# Patient Record
Sex: Female | Born: 1965 | Race: White | Hispanic: No | Marital: Single | Smoking: Former smoker
Health system: Southern US, Community
[De-identification: ages and names within clinical notes are randomized; demographics above are authoritative.]

## PROBLEM LIST (undated history)

## (undated) DIAGNOSIS — I10 Essential (primary) hypertension: Secondary | ICD-10-CM

## (undated) DIAGNOSIS — C50919 Malignant neoplasm of unspecified site of unspecified female breast: Secondary | ICD-10-CM

## (undated) DIAGNOSIS — K589 Irritable bowel syndrome without diarrhea: Secondary | ICD-10-CM

## (undated) DIAGNOSIS — E785 Hyperlipidemia, unspecified: Secondary | ICD-10-CM

## (undated) DIAGNOSIS — K219 Gastro-esophageal reflux disease without esophagitis: Secondary | ICD-10-CM

## (undated) HISTORY — DX: Irritable bowel syndrome, unspecified: K58.9

## (undated) HISTORY — DX: Essential (primary) hypertension: I10

## (undated) HISTORY — PX: BREAST LUMPECTOMY: SHX2

## (undated) HISTORY — DX: Hyperlipidemia, unspecified: E78.5

## (undated) HISTORY — DX: Gastro-esophageal reflux disease without esophagitis: K21.9

## (undated) HISTORY — PX: HERNIA REPAIR: SHX51

---

## 1988-12-11 HISTORY — PX: TUBAL LIGATION: SHX77

## 2001-03-27 ENCOUNTER — Other Ambulatory Visit: Admission: RE | Admit: 2001-03-27 | Discharge: 2001-03-27 | Payer: Self-pay | Admitting: Family Medicine

## 2009-11-12 ENCOUNTER — Encounter: Admission: RE | Admit: 2009-11-12 | Discharge: 2009-12-09 | Payer: Self-pay | Admitting: Family Medicine

## 2012-08-08 ENCOUNTER — Encounter: Payer: Self-pay | Admitting: Gastroenterology

## 2012-09-30 ENCOUNTER — Encounter: Payer: Self-pay | Admitting: Gastroenterology

## 2013-08-12 ENCOUNTER — Encounter: Payer: Self-pay | Admitting: Family Medicine

## 2013-08-12 ENCOUNTER — Ambulatory Visit (INDEPENDENT_AMBULATORY_CARE_PROVIDER_SITE_OTHER): Payer: BC Managed Care – PPO | Admitting: Family Medicine

## 2013-08-12 VITALS — BP 155/90 | HR 101 | Temp 99.9°F | Ht 63.0 in | Wt 186.4 lb

## 2013-08-12 DIAGNOSIS — L0291 Cutaneous abscess, unspecified: Secondary | ICD-10-CM

## 2013-08-12 DIAGNOSIS — L0390 Cellulitis, unspecified: Secondary | ICD-10-CM

## 2013-08-12 MED ORDER — CEPHALEXIN 500 MG PO CAPS: 500.0000 mg | ORAL_CAPSULE | Freq: Three times a day (TID) | ORAL | Status: DC

## 2013-08-13 NOTE — Progress Notes (Signed)
  Subjective:    Patient ID: Grace Gomez, female    DOB: [birth year], 47 y.o.   MRN: [id number]  HPI This 47 y.o. female presents for evaluation of possible ingrown hair in her groin area.   Review of Systems C/o ingrown hair left groin   No chest pain, SOB, HA, dizziness, vision change, N/V, diarrhea, constipation, dysuria, urinary urgency or frequency, myalgias, arthralgias or rash.  Objective:   Physical Exam Vital signs noted  Well developed well nourished female.  HEENT - Head atraumatic Normocephalic                Eyes - PERRLA, Conjuctiva - clear Sclera- Clear EOMI                Ears - EAC's Wnl TM's Wnl Gross Hearing WNL                Nose - Nares patent                 Throat - oropharanx wnl Respiratory - Lungs CTA bilateral Cardiac - RRR S1 and S2 without murmur Skin - Area of erythema and swelling right groin area without cyst or abcess.  approx Diameter is 2cm       Assessment & Plan:  Cellulitis - Plan: cephALEXin (KEFLEX) 500 MG capsule po qid x 7days #28  Apply warm Soaks.  Follow up prn.

## 2013-08-13 NOTE — Patient Instructions (Signed)
Cellulitis Cellulitis is an infection of the skin and the tissue beneath it. The infected area is usually red and tender. Cellulitis occurs most often in the arms and lower legs.  CAUSES  Cellulitis is caused by bacteria that enter the skin through cracks or cuts in the skin. The most common types of bacteria that cause cellulitis are Staphylococcus and Streptococcus. SYMPTOMS   Redness and warmth.  Swelling.  Tenderness or pain.  Fever. DIAGNOSIS  Your caregiver can usually determine what is wrong based on a physical exam. Blood tests may also be done. TREATMENT  Treatment usually involves taking an antibiotic medicine. HOME CARE INSTRUCTIONS   Take your antibiotics as directed. Finish them even if you start to feel better.  Keep the infected arm or leg elevated to reduce swelling.  Apply a warm cloth to the affected area up to 4 times per day to relieve pain.  Only take over-the-counter or prescription medicines for pain, discomfort, or fever as directed by your caregiver.  Keep all follow-up appointments as directed by your caregiver. SEEK MEDICAL CARE IF:   You notice red streaks coming from the infected area.  Your red area gets larger or turns dark in color.  Your bone or joint underneath the infected area becomes painful after the skin has healed.  Your infection returns in the same area or another area.  You notice a swollen bump in the infected area.  You develop new symptoms. SEEK IMMEDIATE MEDICAL CARE IF:   You have a fever.  You feel very sleepy.  You develop vomiting or diarrhea.  You have a general ill feeling (malaise) with muscle aches and pains. MAKE SURE YOU:   Understand these instructions.  Will watch your condition.  Will get help right away if you are not doing well or get worse. Document Released: 09/06/2005 Document Revised: 05/28/2012 Document Reviewed: 02/12/2012 ExitCare Patient Information 2014 [employer].  

## 2013-08-20 ENCOUNTER — Ambulatory Visit (INDEPENDENT_AMBULATORY_CARE_PROVIDER_SITE_OTHER): Payer: BC Managed Care – PPO | Admitting: Family Medicine

## 2013-08-20 ENCOUNTER — Encounter: Payer: Self-pay | Admitting: Family Medicine

## 2013-08-20 VITALS — BP 127/71 | HR 76 | Temp 97.5°F | Ht 63.0 in | Wt 187.0 lb

## 2013-08-20 DIAGNOSIS — Z0000 Encounter for general adult medical examination without abnormal findings: Secondary | ICD-10-CM

## 2013-08-20 DIAGNOSIS — L309 Dermatitis, unspecified: Secondary | ICD-10-CM

## 2013-08-20 DIAGNOSIS — Z01419 Encounter for gynecological examination (general) (routine) without abnormal findings: Secondary | ICD-10-CM

## 2013-08-20 LAB — POCT CBC
Granulocyte percent: 74.9 %G (ref 37–80)
HCT, POC: 39.2 % (ref 37.7–47.9)
Hemoglobin: 14 g/dL (ref 12.2–16.2)
Lymph, poc: 1.9 (ref 0.6–3.4)
MCH, POC: 30.8 pg (ref 27–31.2)
MCHC: 35.6 g/dL — AB (ref 31.8–35.4)
MCV: 86.7 fL (ref 80–97)
MPV: 7.9 fL (ref 0–99.8)
POC Granulocyte: 7 — AB (ref 2–6.9)
POC LYMPH PERCENT: 20.4 %L (ref 10–50)
Platelet Count, POC: 252 10*3/uL (ref 142–424)
RBC: 4.5 M/uL (ref 4.04–5.48)
RDW, POC: 12.5 %
WBC: 9.3 10*3/uL (ref 4.6–10.2)

## 2013-08-20 MED ORDER — NYSTATIN-TRIAMCINOLONE 100000-0.1 UNIT/GM-% EX OINT: TOPICAL_OINTMENT | Freq: Two times a day (BID) | CUTANEOUS | Status: DC

## 2013-08-20 NOTE — Addendum Note (Signed)
Addended by: Roselyn Reef on: 08/20/2013 02:31 PM   Modules accepted: Orders

## 2013-08-20 NOTE — Patient Instructions (Signed)
Pap Test A Pap test checks the cells on the surface of your cervix. Your doctor will look for cell changes that are not normal, an infection, or cancer. If the cells no longer look normal, it is called dysplasia. Dysplasia can turn into cancer. Regular Pap tests are important to stop cancer from developing. BEFORE THE PROCEDURE  Ask your doctor when to schedule your Pap test. Timing the test around your period may be important.  Do not douche or have sex (intercourse) for 24 hours before the test.  Do not put creams on your vagina or use tampons for 24 hours before the test.  Go pee (urinate) just before the test. PROCEDURE  You will lie on an exam table with your feet in stirrups.  A warm metal or plastic tool (speculum) will be put in your vagina to open it up.  Your doctor will use a small, plastic brush or wooden spatula to take cells from your cervix.  The cells will be put in a lab container.  The cells will be checked under a microscope to see if they are normal or not. AFTER THE PROCEDURE Get your test results. If they are abnormal, you may need more tests. Document Released: 12/30/2010 Document Revised: 02/19/2012 Document Reviewed: 11/23/2011 ExitCare Patient Information 2014 [employer].  

## 2013-08-20 NOTE — Progress Notes (Signed)
  Subjective:    Patient ID: Grace Gomez, female    DOB: 19-Feb-1966, 47 y.o.   MRN: [id number]  HPI This 47 y.o. female presents for evaluation of CPE with pap.  She is here for women's health PE.  She is G3 P3.  She had NVD without complications.  She is still menstruating.  She  Has normal periods.  She has been having some hot flashes.  She has no abnormal pap Smears.  She has hx of abnormal mammo last year and she had a repeat mammo that was Normal.  She is c/o of skin tag on her peri region.   Review of Systems No chest pain, SOB, HA, dizziness, vision change, N/V, diarrhea, constipation, dysuria, urinary urgency or frequency, myalgias, arthralgias or rash.     Objective:   Physical Exam Vital signs noted  Well developed well nourished female.  HEENT - Head atraumatic Normocephalic                Throat - oropharanx wnl Respiratory - Lungs CTA bilateral Cardiac - RRR S1 and S2 without murmur Breast - No masses and WNL bilateral, no nipple DC bilateral. GI - Abdomen soft Nontender and bowel sounds active x 4 GU - BUS normal, vagina rugated and wnl, cervix parous os, negative CMT and adenexa without masses. Rectal- tone wnl no masses hemocult negative Extremities - No edema. Neuro - Grossly intact. Skin- Dermatitis left perineum      Assessment & Plan:  Encounter for routine gynecological examination - Plan: Pap IG w/ reflex to HPV when ASC-U, POCT CBC, CMP14+EGFR, Lipid panel, Thyroid Panel With TSH. She is awaiting her mammogram appointment.    Dermatitis - Plan: nystatin-triamcinolone ointment (MYCOLOG)

## 2013-08-21 ENCOUNTER — Other Ambulatory Visit: Payer: Self-pay | Admitting: Family Medicine

## 2013-08-21 DIAGNOSIS — E785 Hyperlipidemia, unspecified: Secondary | ICD-10-CM

## 2013-08-21 LAB — LIPID PANEL
Chol/HDL Ratio: 6.2 ratio units — ABNORMAL HIGH (ref 0.0–4.4)
Cholesterol, Total: 236 mg/dL — ABNORMAL HIGH (ref 100–199)
HDL: 38 mg/dL — ABNORMAL LOW (ref >39)
LDL Calculated: 152 mg/dL — ABNORMAL HIGH (ref 0–99)
Triglycerides: 229 mg/dL — ABNORMAL HIGH (ref 0–149)
VLDL Cholesterol Cal: 46 mg/dL — ABNORMAL HIGH (ref 5–40)

## 2013-08-21 LAB — CMP14+EGFR
ALT: 14 IU/L (ref 0–32)
AST: 12 IU/L (ref 0–40)
Albumin/Globulin Ratio: 1.8 (ref 1.1–2.5)
Albumin: 4 g/dL (ref 3.5–5.5)
Alkaline Phosphatase: 82 IU/L (ref 39–117)
BUN/Creatinine Ratio: 17 (ref 9–23)
BUN: 13 mg/dL (ref 6–24)
CO2: 23 mmol/L (ref 18–29)
Calcium: 9.7 mg/dL (ref 8.7–10.2)
Chloride: 101 mmol/L (ref 97–108)
Creatinine, Ser: 0.78 mg/dL (ref 0.57–1.00)
GFR calc Af Amer: 103 mL/min/{1.73_m2} (ref >59)
GFR calc non Af Amer: 90 mL/min/{1.73_m2} (ref >59)
Globulin, Total: 2.2 g/dL (ref 1.5–4.5)
Glucose: 112 mg/dL — ABNORMAL HIGH (ref 65–99)
Potassium: 4.2 mmol/L (ref 3.5–5.2)
Sodium: 137 mmol/L (ref 134–144)
Total Bilirubin: 0.3 mg/dL (ref 0.0–1.2)
Total Protein: 6.2 g/dL (ref 6.0–8.5)

## 2013-08-21 LAB — THYROID PANEL WITH TSH
Free Thyroxine Index: 1.8 (ref 1.2–4.9)
T3 Uptake Ratio: 28 % (ref 24–39)
T4, Total: 6.6 ug/dL (ref 4.5–12.0)
TSH: 1.57 u[IU]/mL (ref 0.450–4.500)

## 2013-08-21 MED ORDER — PRAVASTATIN SODIUM 40 MG PO TABS: 40.0000 mg | ORAL_TABLET | Freq: Every day | ORAL | Status: DC

## 2013-08-23 LAB — GYN REPORT: PAP Smear Comment: 0

## 2013-08-23 LAB — SPECIMEN STATUS REPORT

## 2013-08-29 ENCOUNTER — Telehealth: Payer: Self-pay | Admitting: Family Medicine

## 2013-08-29 NOTE — Telephone Encounter (Signed)
Pt notified of lab results PA at work started her on Crestor 20mg  April, please check on pap and advise pt OK to leave message on machine

## 2013-08-29 NOTE — Telephone Encounter (Signed)
Message copied by Bearl Mulberry on Fri Aug 29, 2013  5:08 PM ------      Message from: Deatra Canter      Created: Fri Aug 29, 2013  8:10 AM       The pap needs repeating. Will notify patient. ------

## 2013-08-29 NOTE — Telephone Encounter (Signed)
Pt notified pap will need to be repeated

## 2013-09-03 ENCOUNTER — Ambulatory Visit: Payer: BC Managed Care – PPO | Admitting: Family Medicine

## 2013-09-03 ENCOUNTER — Other Ambulatory Visit: Payer: Self-pay | Admitting: *Deleted

## 2013-09-03 MED ORDER — OMEPRAZOLE 40 MG PO CPDR: 40.0000 mg | DELAYED_RELEASE_CAPSULE | Freq: Every day | ORAL | Status: DC

## 2013-09-17 ENCOUNTER — Telehealth: Payer: Self-pay | Admitting: Family Medicine

## 2013-09-19 NOTE — Telephone Encounter (Signed)
XRAY CAN YOU HELP WITH THIS PLEASE

## 2013-09-22 ENCOUNTER — Ambulatory Visit: Payer: BC Managed Care – PPO | Admitting: Family Medicine

## 2013-10-01 ENCOUNTER — Ambulatory Visit (INDEPENDENT_AMBULATORY_CARE_PROVIDER_SITE_OTHER): Payer: BC Managed Care – PPO | Admitting: Family Medicine

## 2013-10-01 VITALS — BP 123/81 | HR 75 | Temp 98.1°F | Ht 63.0 in | Wt 188.0 lb

## 2013-10-01 DIAGNOSIS — Z23 Encounter for immunization: Secondary | ICD-10-CM

## 2013-10-01 DIAGNOSIS — Z01419 Encounter for gynecological examination (general) (routine) without abnormal findings: Secondary | ICD-10-CM

## 2013-10-01 NOTE — Addendum Note (Signed)
Addended by: Orma Render F on: 10/01/2013 11:00 AM   Modules accepted: Orders

## 2013-10-01 NOTE — Progress Notes (Signed)
  Subjective:    Patient ID: Grace Gomez, female    DOB: 06/22/1966, 47 y.o.   MRN: [id number]  HPI This 47 y.o. female presents for evaluation of repeat pap smear.  There was evidently a lab Error and she was unagle to get her pap.   Review of Systems No chest pain, SOB, HA, dizziness, vision change, N/V, diarrhea, constipation, dysuria, urinary urgency or frequency, myalgias, arthralgias or rash.     Objective:   Physical Exam Vital signs noted  Well developed well nourished female.  HEENT - Head atraumatic Normocephalic                Eyes - PERRLA, Conjuctiva - clear Sclera- Clear EOMI Resp - Lungs CTA Card - RRR s1 and s2 w/o Murmur. GU - Bus normal , vagina normal and cervix parous os and pap re-obtained.      Assessment & Plan:  Pap smear - Reobtained.

## 2013-10-06 LAB — PAP IG W/ RFLX HPV ASCU: PAP Smear Comment: 0

## 2014-02-06 ENCOUNTER — Ambulatory Visit (INDEPENDENT_AMBULATORY_CARE_PROVIDER_SITE_OTHER): Payer: BC Managed Care – PPO | Admitting: Family Medicine

## 2014-02-06 ENCOUNTER — Ambulatory Visit (INDEPENDENT_AMBULATORY_CARE_PROVIDER_SITE_OTHER): Payer: BC Managed Care – PPO

## 2014-02-06 VITALS — BP 131/79 | HR 92 | Temp 97.1°F | Wt 193.0 lb

## 2014-02-06 DIAGNOSIS — R0602 Shortness of breath: Secondary | ICD-10-CM

## 2014-02-06 DIAGNOSIS — J209 Acute bronchitis, unspecified: Secondary | ICD-10-CM

## 2014-02-06 MED ORDER — ALBUTEROL SULFATE (2.5 MG/3ML) 0.083% IN NEBU: 2.5000 mg | INHALATION_SOLUTION | Freq: Four times a day (QID) | RESPIRATORY_TRACT | Status: DC | PRN

## 2014-02-06 MED ORDER — METHYLPREDNISOLONE SODIUM SUCC 125 MG IJ SOLR
125.0000 mg | Freq: Once | INTRAMUSCULAR | Status: AC
  Administered 2014-02-06: 125 mg via INTRAMUSCULAR

## 2014-02-06 MED ORDER — PREDNISONE 10 MG PO TABS: ORAL_TABLET | ORAL | Status: DC

## 2014-02-06 MED ORDER — UMECLIDINIUM-VILANTEROL 62.5-25 MCG/INH IN AEPB: 1.0000 | INHALATION_SPRAY | RESPIRATORY_TRACT | Status: DC

## 2014-02-06 MED ORDER — LEVOFLOXACIN 500 MG PO TABS: 500.0000 mg | ORAL_TABLET | Freq: Every day | ORAL | Status: DC

## 2014-02-06 MED ORDER — LEVALBUTEROL HCL 1.25 MG/3ML IN NEBU
1.2500 mg | INHALATION_SOLUTION | Freq: Once | RESPIRATORY_TRACT | Status: AC
  Administered 2014-02-06: 1.25 mg via RESPIRATORY_TRACT

## 2014-02-06 MED ORDER — LEVALBUTEROL HCL 1.25 MG/0.5ML IN NEBU: 1.2500 mg | INHALATION_SOLUTION | Freq: Once | RESPIRATORY_TRACT | Status: DC

## 2014-02-06 NOTE — Progress Notes (Signed)
Subjective:    Patient ID: Grace Gomez, female    DOB: July 04, 1966, 48 y.o.   MRN: [id number]  HPI This 48 y.o. female presents for evaluation of cough and shortness of breath.  She was  Seen Sunday in Urgent care and rx'd zpak and and steroid shot.  She was also rx'd tylenol #3 for cough and chest wall pain from cough.  She followed up 2 days later because she was SOB and she was wheezing and had tightness in her chest.  She was rx'd prednisone 50mg  po qd And she finished the prednisone today but is having sob and wheezing.  She has had 4 bouts of bronchitis this year.  She is usually tx'd by the PA at work.  She has 35 year hx of cigarette smoking  1 1/2 ppd.  She quit smoking 12 years ago.  She states she was told by the doctor at urgent care she needed a pulmonary referral for her SOB and suspected COPD.  She is also using saba albuterol MDI 2 puffs 4x day w/o relief.   Review of Systems C/o SOB, wheezing, cough, and uri sx's. No chest pain, HA, dizziness, vision change, N/V, diarrhea, constipation, dysuria, urinary urgency or frequency, myalgias, arthralgias or rash.     Objective:   Physical Exam Vital signs noted  Well developed well nourished female.  HEENT - Head atraumatic Normocephalic                Eyes - PERRLA, Conjuctiva - clear Sclera- Clear EOMI                Ears - EAC's Wnl TM's Wnl Gross Hearing WNL                Throat - oropharanx wnl Respiratory - Lungs with scattered wheezes bilateral A/P Cardiac - RRR S1 and S2 without murmur GI - Abdomen soft Nontender and bowel sounds active x 4 Extremities - No edema. Neuro - Grossly intact.  Patient is walked with pulse oximeter on room air and oxygen saturation was initially 98% and patient Was dyspneic and oxygen saturation drops to 94% after recovery period and then returns to 97%.   Neb tx - Post neb breath sounds with expiratory wheezes bilateral scattered and patient states she is breathing easier.   CXR  - No infiltrates Prelimnary reading by Iverson Alamin    Assessment & Plan:  SOB (shortness of breath) - Plan: DG Chest 2 View, levalbuterol (XOPENEX) nebulizer solution 1.25 mg, methylPREDNISolone sodium succinate (SOLU-MEDROL) 125 mg/2 mL injection 125 mg, levalbuterol (XOPENEX) nebulizer solution 1.25 mg, albuterol (PROVENTIL) (2.5 MG/3ML) 0.083% nebulizer solution, DME Nebulizer machine, levofloxacin (LEVAQUIN) 500 MG tablet, predniSONE (DELTASONE) 10 MG tablet, Ambulatory referral to Pulmonology, Umeclidinium-Vilanterol (ANORO ELLIPTA) 62.5-25 MCG/INH AEPB  Acute bronchitis - Plan: levalbuterol (XOPENEX) nebulizer solution 1.25 mg, methylPREDNISolone sodium succinate (SOLU-MEDROL) 125 mg/2 mL injection 125 mg, levalbuterol (XOPENEX) nebulizer solution 1.25 mg, albuterol (PROVENTIL) (2.5 MG/3ML) 0.083% nebulizer solution, DME Nebulizer machine, levofloxacin (LEVAQUIN) 500 MG tablet, predniSONE (DELTASONE) 10 MG tablet, Ambulatory referral to Pulmonology, Umeclidinium-Vilanterol (ANORO ELLIPTA) 62.5-25 MCG/INH AEPB  Anoro on puff qd #2 samples with savings card given.  Patient given neb in office and neb tubing given To patient along with rx for nebulizer.  Patient is advised to follow up prn if sx's continue or worsen. Discussed with patient that I am rx an 8 day steroid taper and to use the nebulizer 3-4 times a day for Next week  and then 2 times a day after this.  Discussed to take mucinex bid and to await call For pulmonary consult in the next few weeks.  Lysbeth Penner FNP

## 2014-02-16 ENCOUNTER — Institutional Professional Consult (permissible substitution): Payer: Self-pay | Admitting: Pulmonary Disease

## 2014-02-18 DIAGNOSIS — Z0289 Encounter for other administrative examinations: Secondary | ICD-10-CM

## 2014-02-27 ENCOUNTER — Telehealth: Payer: Self-pay | Admitting: Family Medicine

## 2014-03-04 NOTE — Telephone Encounter (Signed)
All FMLA papers complete, pt aware

## 2014-03-12 ENCOUNTER — Telehealth: Payer: Self-pay | Admitting: Family Medicine

## 2014-03-12 NOTE — Telephone Encounter (Signed)
Patient aware that we advise her to go to the ER for evaluation that this possibly could be coming from where she had surgery

## 2014-05-11 ENCOUNTER — Telehealth: Payer: Self-pay | Admitting: Family Medicine

## 2014-05-11 NOTE — Telephone Encounter (Signed)
appt given for thurs with Grace Gomez- patient states she does not have any sob, chest pain, patient requested a wed or thurs appt and Grace Gomez is off on Friday

## 2014-05-14 ENCOUNTER — Ambulatory Visit (INDEPENDENT_AMBULATORY_CARE_PROVIDER_SITE_OTHER): Payer: BC Managed Care – PPO | Admitting: Family Medicine

## 2014-05-14 ENCOUNTER — Encounter: Payer: Self-pay | Admitting: Family Medicine

## 2014-05-14 VITALS — BP 141/88 | HR 89 | Temp 97.1°F | Wt 191.0 lb

## 2014-05-14 DIAGNOSIS — R609 Edema, unspecified: Secondary | ICD-10-CM

## 2014-05-14 DIAGNOSIS — N390 Urinary tract infection, site not specified: Secondary | ICD-10-CM

## 2014-05-14 DIAGNOSIS — R350 Frequency of micturition: Secondary | ICD-10-CM

## 2014-05-14 LAB — POCT URINALYSIS DIPSTICK
Bilirubin, UA: NEGATIVE
Blood, UA: NEGATIVE
Glucose, UA: NEGATIVE
Ketones, UA: NEGATIVE
Leukocytes, UA: NEGATIVE
Nitrite, UA: NEGATIVE
Spec Grav, UA: >=1.03
Urobilinogen, UA: NEGATIVE
pH, UA: 5

## 2014-05-14 LAB — POCT UA - MICROSCOPIC ONLY
Casts, Ur, LPF, POC: NEGATIVE
Crystals, Ur, HPF, POC: NEGATIVE
Mucus, UA: NEGATIVE
RBC, urine, microscopic: NEGATIVE
Yeast, UA: NEGATIVE

## 2014-05-14 MED ORDER — FUROSEMIDE 20 MG PO TABS: ORAL_TABLET | ORAL | Status: DC

## 2014-05-14 MED ORDER — CIPROFLOXACIN HCL 500 MG PO TABS: 500.0000 mg | ORAL_TABLET | Freq: Two times a day (BID) | ORAL | Status: DC

## 2014-05-14 NOTE — Progress Notes (Signed)
   Subjective:    Patient ID: Grace Gomez, female    DOB: 1966-05-24, 48 y.o.   MRN: [id number]  HPI  This 48 y.o. female presents for evaluation of urinary frequency and edema in lower extremities.  She notices a strong Urine odor and frequency.  Review of Systems C/o urinary frequency and lower extremity edema.   No chest pain, SOB, HA, dizziness, vision change, N/V, diarrhea, constipation,myalgias, arthralgias or rash.  Objective:   Physical Exam  Vital signs noted  Well developed well nourished female.  HEENT - Head atraumatic Normocephalic                Eyes - PERRLA, Conjuctiva - clear Sclera- Clear EOMI                Ears - EAC's Wnl TM's Wnl Gross Hearing WNL                Throat - oropharanx wnl Respiratory - Lungs CTA bilateral Cardiac - RRR S1 and S2 without murmur GI - Abdomen soft Nontender and bowel sounds active x 4 Extremities - mild pre tibial edema      Assessment & Plan:  Urinary frequency - Plan: POCT UA - Microscopic Only, POCT urinalysis dipstick, ciprofloxacin (CIPRO) 500 MG tablet, Urine culture  Edema - Plan: furosemide (LASIX) 20 MG tablet, ciprofloxacin (CIPRO) 500 MG tablet, Urine culture  UTI (urinary tract infection) - Plan: furosemide (LASIX) 20 MG tablet, ciprofloxacin (CIPRO) 500 MG tablet, Urine culture  Lysbeth Penner FNP

## 2014-05-16 LAB — URINE CULTURE

## 2014-05-18 ENCOUNTER — Telehealth: Payer: Self-pay | Admitting: Family Medicine

## 2014-05-18 NOTE — Telephone Encounter (Signed)
Message copied by Waverly Ferrari on Mon May 18, 2014  2:51 PM ------      Message from: Lysbeth Penner      Created: Mon May 18, 2014  2:08 PM       ua cx is positive for uti and is sensitive to cipro ------

## 2014-06-14 ENCOUNTER — Other Ambulatory Visit: Payer: Self-pay | Admitting: Family Medicine

## 2014-06-15 ENCOUNTER — Telehealth: Payer: Self-pay | Admitting: Family Medicine

## 2014-06-15 MED ORDER — OMEPRAZOLE 40 MG PO CPDR
40.0000 mg | DELAYED_RELEASE_CAPSULE | Freq: Every day | ORAL | Status: DC
Start: 2014-06-15 — End: 2014-09-02

## 2014-06-15 NOTE — Telephone Encounter (Signed)
done

## 2014-08-24 IMAGING — CR DG CHEST 2V
2 series · 2 of 2 positions shown · non-contrast
Comparison: Chest x-ray 12/12/2011

CLINICAL DATA: Shortness of breath, history of bronchitis

EXAM:
CHEST  2 VIEW

[view not recorded (1 of 2)]
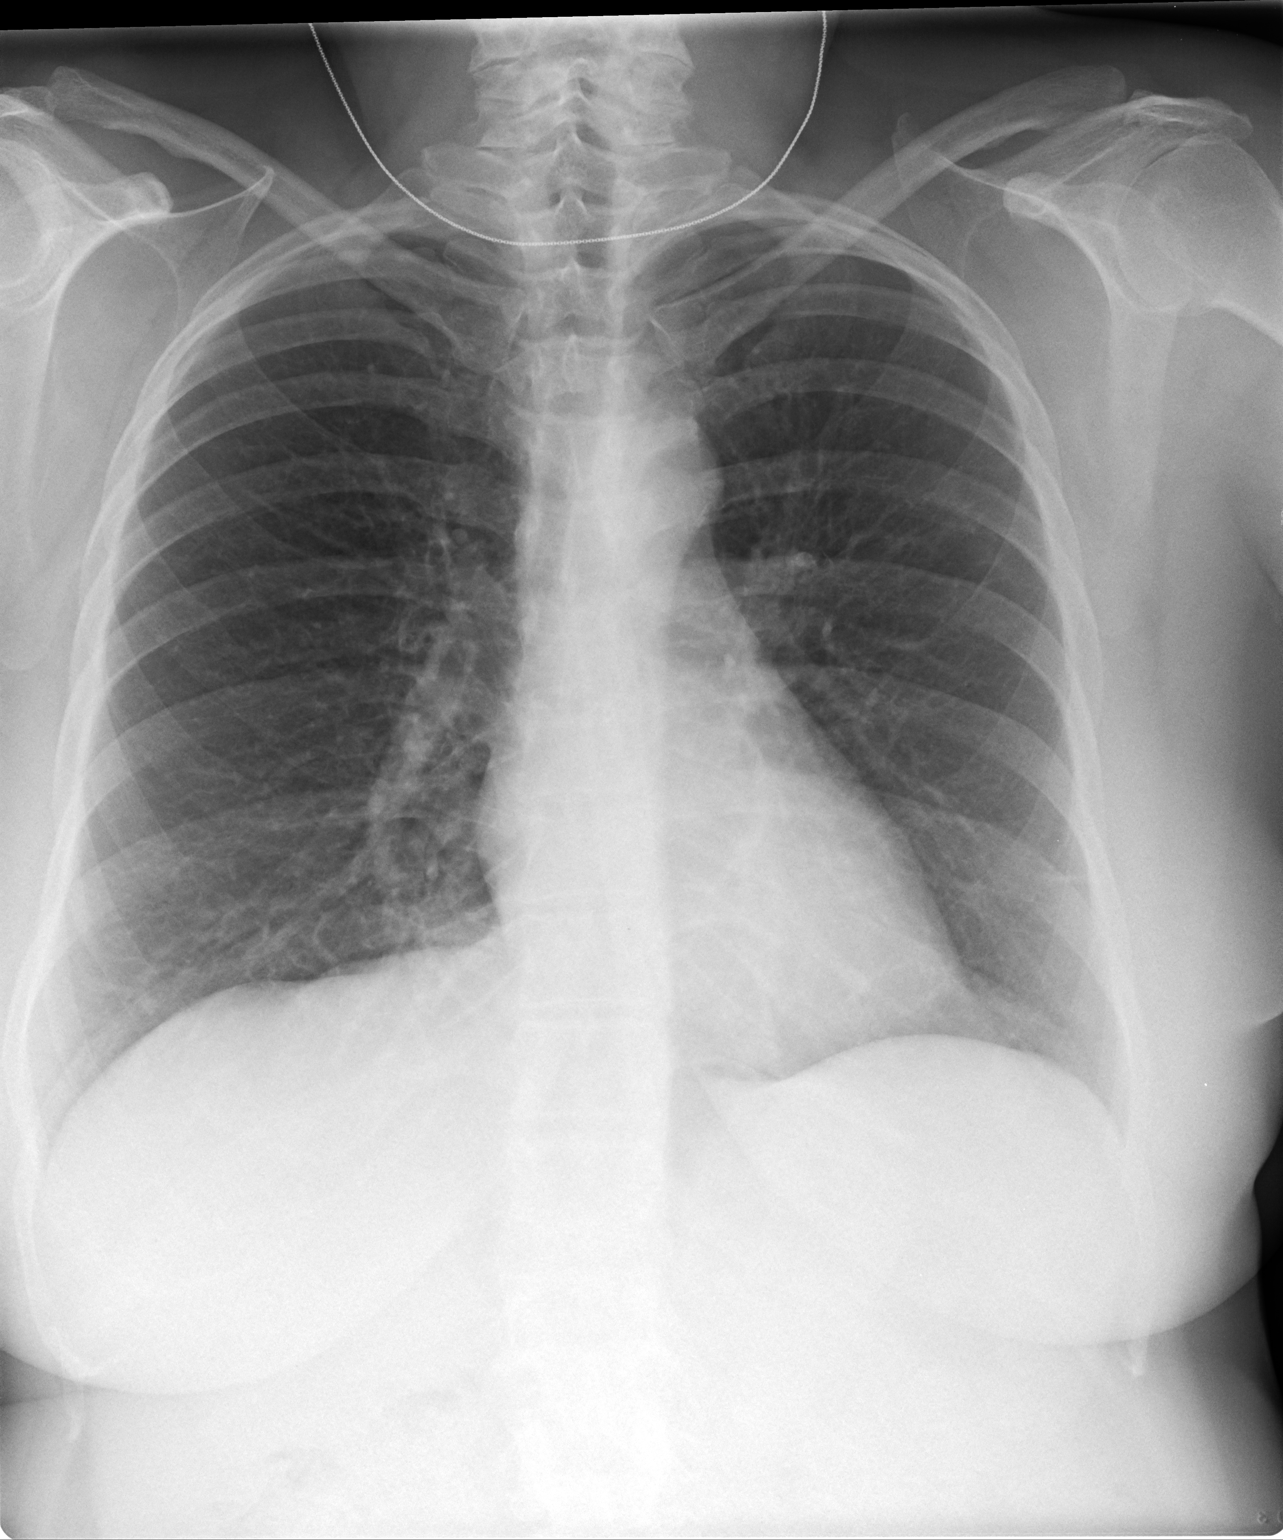

[view not recorded (2 of 2)]
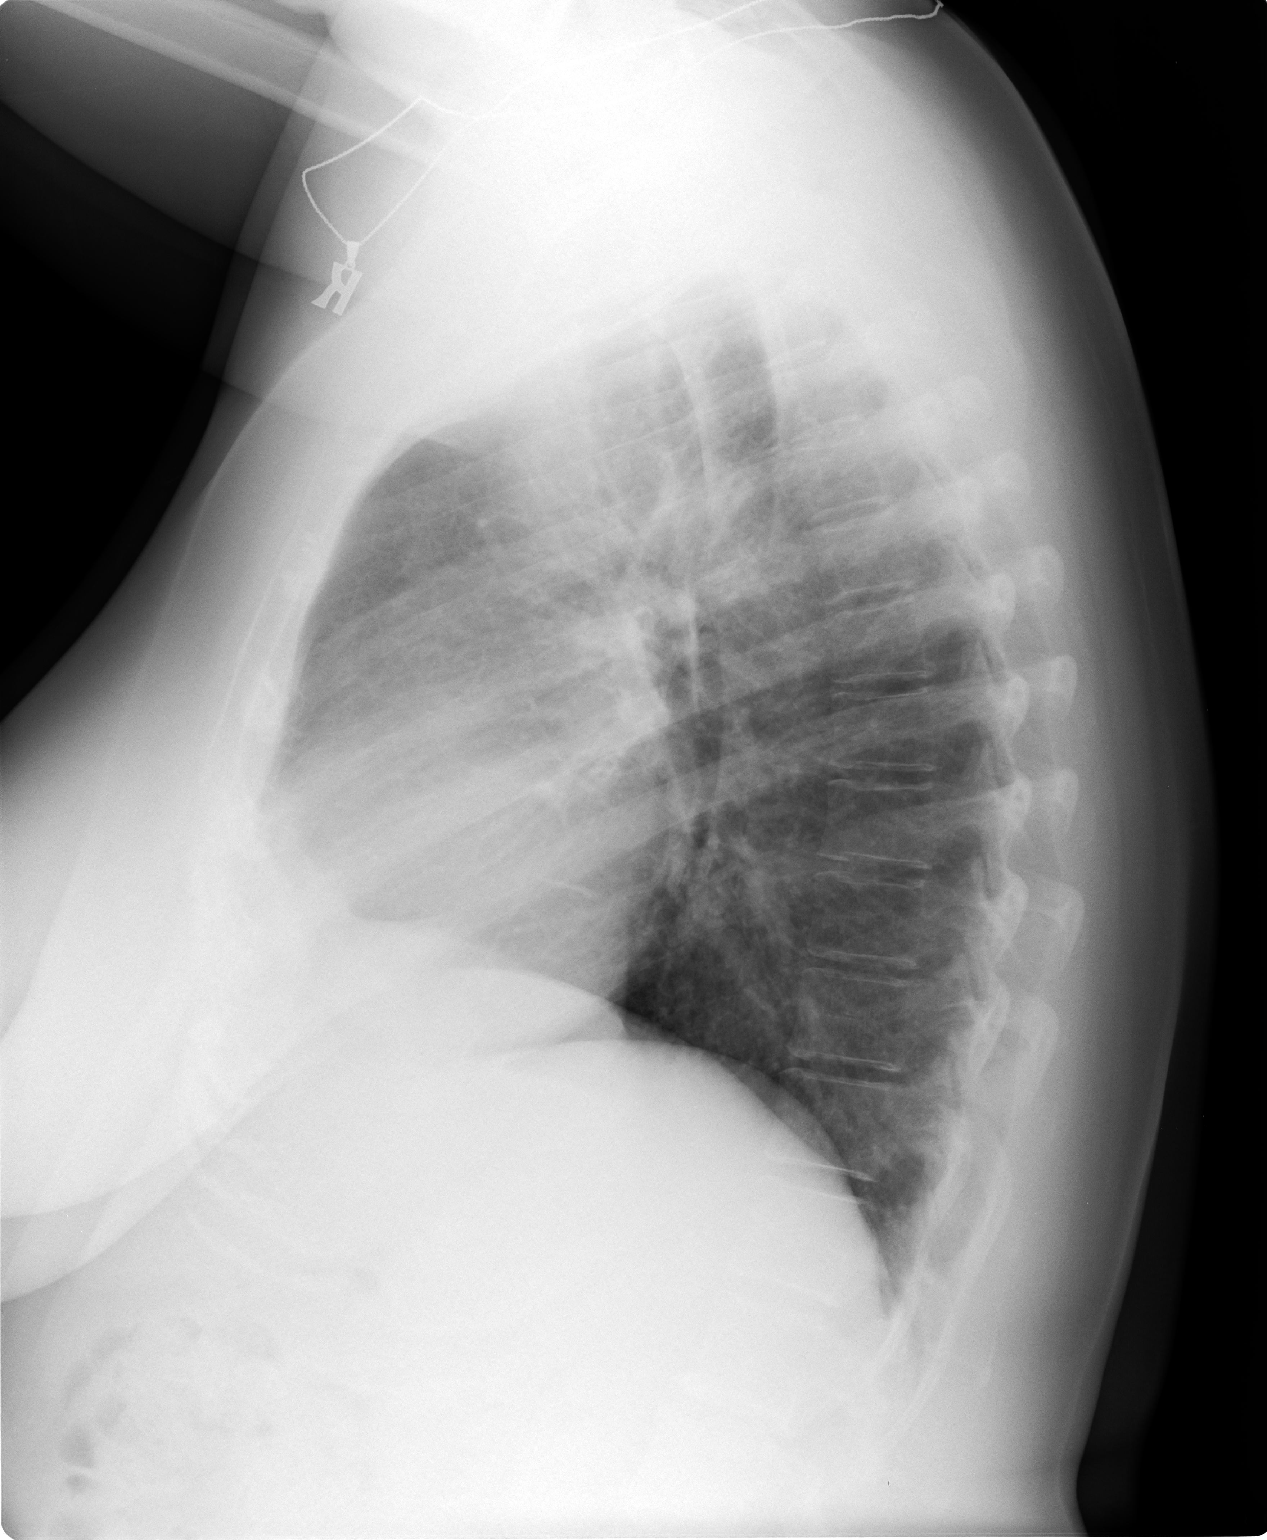

[2 of 2 positions shown; findings below may reference images not displayed]

FINDINGS: No active infiltrate or effusion is seen. Mediastinal contours
appear stable. The heart is within normal limits in size. No bony
abnormality is seen.
IMPRESSION: No active lung disease.

## 2014-09-02 ENCOUNTER — Other Ambulatory Visit: Payer: Self-pay | Admitting: Family Medicine

## 2014-10-19 ENCOUNTER — Other Ambulatory Visit: Payer: BC Managed Care – PPO | Admitting: Family Medicine

## 2014-11-18 ENCOUNTER — Other Ambulatory Visit: Payer: Self-pay | Admitting: Family Medicine

## 2014-11-18 NOTE — Telephone Encounter (Signed)
Last ov 6/15

## 2015-02-16 ENCOUNTER — Encounter: Payer: Self-pay | Admitting: *Deleted

## 2015-02-26 ENCOUNTER — Telehealth: Payer: Self-pay | Admitting: Family

## 2015-02-26 NOTE — Telephone Encounter (Signed)
Pt given appt with Christy 3/29 at 12:10.

## 2015-03-02 ENCOUNTER — Other Ambulatory Visit: Payer: Self-pay | Admitting: Family

## 2015-03-03 ENCOUNTER — Encounter: Payer: Self-pay | Admitting: Family

## 2015-03-03 ENCOUNTER — Ambulatory Visit (INDEPENDENT_AMBULATORY_CARE_PROVIDER_SITE_OTHER): Payer: BLUE CROSS/BLUE SHIELD | Admitting: Family

## 2015-03-03 VITALS — BP 146/90 | HR 91 | Temp 97.0°F | Ht 63.0 in | Wt 188.0 lb

## 2015-03-03 DIAGNOSIS — N760 Acute vaginitis: Secondary | ICD-10-CM

## 2015-03-03 DIAGNOSIS — N898 Other specified noninflammatory disorders of vagina: Secondary | ICD-10-CM

## 2015-03-03 DIAGNOSIS — A499 Bacterial infection, unspecified: Secondary | ICD-10-CM | POA: Diagnosis not present

## 2015-03-03 DIAGNOSIS — B9689 Other specified bacterial agents as the cause of diseases classified elsewhere: Secondary | ICD-10-CM

## 2015-03-03 LAB — POCT WET PREP WITH KOH
KOH Prep POC: POSITIVE
RBC Wet Prep HPF POC: NEGATIVE
Trichomonas, UA: NEGATIVE
Yeast Wet Prep HPF POC: NEGATIVE

## 2015-03-03 MED ORDER — METRONIDAZOLE 500 MG PO TABS: 500.0000 mg | ORAL_TABLET | Freq: Two times a day (BID) | ORAL | Status: DC

## 2015-03-03 NOTE — Progress Notes (Signed)
   Subjective:    Patient ID: Grace Gomez, female    DOB: 1966-01-27, 49 y.o.   MRN: [id number]  Vaginal Discharge The patient's primary symptoms include genital itching, a genital odor and vaginal discharge. The patient's pertinent negatives include no genital lesions or vaginal bleeding. This is a new problem. The current episode started 1 to 4 weeks ago. The problem occurs constantly. The problem has been waxing and waning. The patient is experiencing no pain. Pertinent negatives include no back pain, chills, constipation, frequency, headaches, hematuria, nausea, painful intercourse, urgency or vomiting. The vaginal discharge was [place], clear and thick.      Review of Systems  Constitutional: Negative.  Negative for chills.  HENT: Negative.   Eyes: Negative.   Respiratory: Negative.  Negative for shortness of breath.   Cardiovascular: Negative.  Negative for palpitations.  Gastrointestinal: Negative.  Negative for nausea, vomiting and constipation.  Endocrine: Negative.   Genitourinary: Positive for vaginal discharge. Negative for urgency, frequency and hematuria.  Musculoskeletal: Negative.  Negative for back pain.  Neurological: Negative.  Negative for headaches.  Hematological: Negative.   Psychiatric/Behavioral: Negative.   All other systems reviewed and are negative.      Objective:   Physical Exam  Constitutional: She is oriented to person, place, and time. She appears well-developed and well-nourished. No distress.  HENT:  Head: Normocephalic and atraumatic.  Right Ear: External ear normal.  Left Ear: External ear normal.  Nose: Nose normal.  Mouth/Throat: Oropharynx is clear and moist.  Eyes: Pupils are equal, round, and reactive to light.  Neck: Normal range of motion. Neck supple. No thyromegaly present.  Cardiovascular: Normal rate, regular rhythm, normal heart sounds and intact distal pulses.   No murmur heard. Pulmonary/Chest: Effort normal and breath sounds  normal. No respiratory distress. She has no wheezes.  Abdominal: Soft. Bowel sounds are normal. She exhibits no distension. There is no tenderness.  Musculoskeletal: Normal range of motion. She exhibits no edema or tenderness.  Neurological: She is alert and oriented to person, place, and time. She has normal reflexes. No cranial nerve deficit.  Skin: Skin is warm and dry.  Psychiatric: She has a normal mood and affect. Her behavior is normal. Judgment and thought content normal.  Vitals reviewed.    BP 146/90 mmHg  Pulse 91  Temp(Src) 97 F (36.1 C) (Oral)  Ht 5\' 3"  (1.6 m)  Wt 188 lb (85.276 kg)  BMI 33.31 kg/m2      Assessment & Plan:  1. Vaginal discharge - POCT Wet Prep with KOH  2. BV (bacterial vaginosis) -Keep clean and dry -Make sure you finish antibiotics  - metroNIDAZOLE (FLAGYL) 500 MG tablet; Take 1 tablet (500 mg total) by mouth 2 (two) times daily.  Dispense: 14 tablet; Refill: 0  Evelina Dun, FNP

## 2015-03-03 NOTE — Patient Instructions (Signed)
Bacterial Vaginosis Bacterial vaginosis is a vaginal infection that occurs when the normal balance of bacteria in the vagina is disrupted. It results from an overgrowth of certain bacteria. This is the most common vaginal infection in women of childbearing age. Treatment is important to prevent complications, especially in pregnant women, as it can cause a premature delivery. CAUSES  Bacterial vaginosis is caused by an increase in harmful bacteria that are normally present in smaller amounts in the vagina. Several different kinds of bacteria can cause bacterial vaginosis. However, the reason that the condition develops is not fully understood. RISK FACTORS Certain activities or behaviors can put you at an increased risk of developing bacterial vaginosis, including:  Having a new sex partner or multiple sex partners.  Douching.  Using an intrauterine device (IUD) for contraception. Women do not get bacterial vaginosis from toilet seats, bedding, swimming pools, or contact with objects around them. SIGNS AND SYMPTOMS  Some women with bacterial vaginosis have no signs or symptoms. Common symptoms include:  Grey vaginal discharge.  A fishlike odor with discharge, especially after sexual intercourse.  Itching or burning of the vagina and vulva.  Burning or pain with urination. DIAGNOSIS  Your health care provider will take a medical history and examine the vagina for signs of bacterial vaginosis. A sample of vaginal fluid may be taken. Your health care provider will look at this sample under a microscope to check for bacteria and abnormal cells. A vaginal pH test may also be done.  TREATMENT  Bacterial vaginosis may be treated with antibiotic medicines. These may be given in the form of a pill or a vaginal cream. A second round of antibiotics may be prescribed if the condition comes back after treatment.  HOME CARE INSTRUCTIONS   Only take over-the-counter or prescription medicines as  directed by your health care provider.  If antibiotic medicine was prescribed, take it as directed. Make sure you finish it even if you start to feel better.  Do not have sex until treatment is completed.  Tell all sexual partners that you have a vaginal infection. They should see their health care provider and be treated if they have problems, such as a mild rash or itching.  Practice safe sex by using condoms and only having one sex partner. SEEK MEDICAL CARE IF:   Your symptoms are not improving after 3 days of treatment.  You have increased discharge or pain.  You have a fever. MAKE SURE YOU:   Understand these instructions.  Will watch your condition.  Will get help right away if you are not doing well or get worse. FOR MORE INFORMATION  Centers for Disease Control and Prevention, Division of STD Prevention: www.cdc.gov/std American Sexual Health Association (ASHA): www.ashastd.org  Document Released: 11/27/2005 Document Revised: 09/17/2013 Document Reviewed: 07/09/2013 Exit[employer] Patient Information 2015 [employer]. This information is not intended to replace advice given to you by your health care provider. Make sure you discuss any questions you have with your health care provider.  

## 2015-03-04 ENCOUNTER — Other Ambulatory Visit: Payer: Self-pay | Admitting: Family

## 2015-03-05 ENCOUNTER — Other Ambulatory Visit: Payer: Self-pay | Admitting: Family Medicine

## 2015-03-09 ENCOUNTER — Other Ambulatory Visit: Payer: BLUE CROSS/BLUE SHIELD | Admitting: Family

## 2015-03-15 ENCOUNTER — Telehealth: Payer: Self-pay | Admitting: Family

## 2015-03-15 MED ORDER — FLUCONAZOLE 150 MG PO TABS: 150.0000 mg | ORAL_TABLET | Freq: Once | ORAL | Status: DC

## 2015-03-15 NOTE — Telephone Encounter (Signed)
Diflucan rx sent to pharmacy.

## 2015-03-17 ENCOUNTER — Telehealth: Payer: Self-pay | Admitting: Family

## 2015-03-17 DIAGNOSIS — B9689 Other specified bacterial agents as the cause of diseases classified elsewhere: Secondary | ICD-10-CM

## 2015-03-17 DIAGNOSIS — N760 Acute vaginitis: Secondary | ICD-10-CM

## 2015-03-17 MED ORDER — METRONIDAZOLE 500 MG PO TABS: 500.0000 mg | ORAL_TABLET | Freq: Two times a day (BID) | ORAL | Status: DC

## 2015-03-17 NOTE — Telephone Encounter (Signed)
Prescription sent to pharmacy.

## 2015-03-30 ENCOUNTER — Other Ambulatory Visit: Payer: BLUE CROSS/BLUE SHIELD | Admitting: Family

## 2015-04-05 ENCOUNTER — Telehealth: Payer: Self-pay | Admitting: Family Medicine

## 2015-04-06 NOTE — Telephone Encounter (Signed)
Appointment given for Friday with Christy  

## 2015-04-08 ENCOUNTER — Telehealth: Payer: Self-pay | Admitting: Family

## 2015-04-08 NOTE — Telephone Encounter (Signed)
Patient aware she can keep her appointment with Grace Gomez tomorrow.

## 2015-04-09 ENCOUNTER — Ambulatory Visit (INDEPENDENT_AMBULATORY_CARE_PROVIDER_SITE_OTHER): Payer: BLUE CROSS/BLUE SHIELD | Admitting: Family

## 2015-04-09 ENCOUNTER — Encounter: Payer: Self-pay | Admitting: Family

## 2015-04-09 VITALS — BP 141/100 | HR 91 | Temp 97.8°F | Ht 63.0 in | Wt 190.4 lb

## 2015-04-09 DIAGNOSIS — I10 Essential (primary) hypertension: Secondary | ICD-10-CM | POA: Diagnosis not present

## 2015-04-09 DIAGNOSIS — A499 Bacterial infection, unspecified: Secondary | ICD-10-CM

## 2015-04-09 DIAGNOSIS — B9689 Other specified bacterial agents as the cause of diseases classified elsewhere: Secondary | ICD-10-CM

## 2015-04-09 DIAGNOSIS — N760 Acute vaginitis: Secondary | ICD-10-CM | POA: Diagnosis not present

## 2015-04-09 DIAGNOSIS — N898 Other specified noninflammatory disorders of vagina: Secondary | ICD-10-CM | POA: Diagnosis not present

## 2015-04-09 LAB — POCT WET PREP (WET MOUNT)
KOH Wet Prep POC: POSITIVE
Trichomonas Wet Prep HPF POC: NEGATIVE

## 2015-04-09 LAB — POCT URINALYSIS DIPSTICK
Bilirubin, UA: NEGATIVE
Glucose, UA: NEGATIVE
Ketones, UA: NEGATIVE
Nitrite, UA: NEGATIVE
PROTEIN UA: NEGATIVE
SPEC GRAV UA: 1.015
Urobilinogen, UA: NEGATIVE
pH, UA: 7

## 2015-04-09 LAB — POCT UA - MICROSCOPIC ONLY
Bacteria, U Microscopic: NEGATIVE
CASTS, UR, LPF, POC: NEGATIVE
CRYSTALS, UR, HPF, POC: NEGATIVE
Mucus, UA: NEGATIVE
YEAST UA: NEGATIVE

## 2015-04-09 MED ORDER — TINIDAZOLE 500 MG PO TABS: 2.0000 g | ORAL_TABLET | Freq: Every day | ORAL | Status: DC

## 2015-04-09 MED ORDER — HYDROCHLOROTHIAZIDE 25 MG PO TABS
25.0000 mg | ORAL_TABLET | Freq: Every day | ORAL | Status: DC
Start: 2015-04-09 — End: 2015-04-21

## 2015-04-09 NOTE — Patient Instructions (Addendum)
Bacterial Vaginosis Bacterial vaginosis is a vaginal infection that occurs when the normal balance of bacteria in the vagina is disrupted. It results from an overgrowth of certain bacteria. This is the most common vaginal infection in women of childbearing age. Treatment is important to prevent complications, especially in pregnant women, as it can cause a premature delivery. CAUSES  Bacterial vaginosis is caused by an increase in harmful bacteria that are normally present in smaller amounts in the vagina. Several different kinds of bacteria can cause bacterial vaginosis. However, the reason that the condition develops is not fully understood. RISK FACTORS Certain activities or behaviors can put you at an increased risk of developing bacterial vaginosis, including:  Having a new sex partner or multiple sex partners.  Douching.  Using an intrauterine device (IUD) for contraception. Women do not get bacterial vaginosis from toilet seats, bedding, swimming pools, or contact with objects around them. SIGNS AND SYMPTOMS  Some women with bacterial vaginosis have no signs or symptoms. Common symptoms include:  Grey vaginal discharge.  A fishlike odor with discharge, especially after sexual intercourse.  Itching or burning of the vagina and vulva.  Burning or pain with urination. DIAGNOSIS  Your health care provider will take a medical history and examine the vagina for signs of bacterial vaginosis. A sample of vaginal fluid may be taken. Your health care provider will look at this sample under a microscope to check for bacteria and abnormal cells. A vaginal pH test may also be done.  TREATMENT  Bacterial vaginosis may be treated with antibiotic medicines. These may be given in the form of a pill or a vaginal cream. A second round of antibiotics may be prescribed if the condition comes back after treatment.  HOME CARE INSTRUCTIONS   Only take over-the-counter or prescription medicines as  directed by your health care provider.  If antibiotic medicine was prescribed, take it as directed. Make sure you finish it even if you start to feel better.  Do not have sex until treatment is completed.  Tell all sexual partners that you have a vaginal infection. They should see their health care provider and be treated if they have problems, such as a mild rash or itching.  Practice safe sex by using condoms and only having one sex partner. SEEK MEDICAL CARE IF:   Your symptoms are not improving after 3 days of treatment.  You have increased discharge or pain.  You have a fever. MAKE SURE YOU:   Understand these instructions.  Will watch your condition.  Will get help right away if you are not doing well or get worse. FOR MORE INFORMATION  Centers for Disease Control and Prevention, Division of STD Prevention: [employer] American Sexual Health Association (ASHA): www.ashastd.org  Document Released: 11/27/2005 Document Revised: 09/17/2013 Document Reviewed: 07/09/2013 [facility] Patient Information 2015 [place], [place]. This information is not intended to replace advice given to you by your health care provider. Make sure you discuss any questions you have with your health care provider. Hypertension Hypertension, commonly called high blood pressure, is when the force of blood pumping through your arteries is too strong. Your arteries are the blood vessels that carry blood from your heart throughout your body. A blood pressure reading consists of a higher number over a lower number, such as 110/72. The higher number (systolic) is the pressure inside your arteries when your heart pumps. The lower number (diastolic) is the pressure inside your arteries when your heart relaxes. Ideally you want your blood pressure  below 120/80. Hypertension forces your heart to work harder to pump blood. Your arteries may become narrow or stiff. Having hypertension puts you at risk for heart disease,  stroke, and other problems.  RISK FACTORS Some risk factors for high blood pressure are controllable. Others are not.  Risk factors you cannot control include:   Race. You may be at higher risk if you are African American.  Age. Risk increases with age.  Gender. Men are at higher risk than women before age 53 years. After age 75, women are at higher risk than men. Risk factors you can control include:  Not getting enough exercise or physical activity.  Being overweight.  Getting too much fat, sugar, calories, or salt in your diet.  Drinking too much alcohol. SIGNS AND SYMPTOMS Hypertension does not usually cause signs or symptoms. Extremely high blood pressure (hypertensive crisis) may cause headache, anxiety, shortness of breath, and nosebleed. DIAGNOSIS  To check if you have hypertension, your health care provider will measure your blood pressure while you are seated, with your arm held at the level of your heart. It should be measured at least twice using the same arm. Certain conditions can cause a difference in blood pressure between your right and left arms. A blood pressure reading that is higher than normal on one occasion does not mean that you need treatment. If one blood pressure reading is high, ask your health care provider about having it checked again. TREATMENT  Treating high blood pressure includes making lifestyle changes and possibly taking medicine. Living a healthy lifestyle can help lower high blood pressure. You may need to change some of your habits. Lifestyle changes may include:  Following the DASH diet. This diet is high in fruits, vegetables, and whole grains. It is low in salt, red meat, and added sugars.  Getting at least 2 hours of brisk physical activity every week.  Losing weight if necessary.  Not smoking.  Limiting alcoholic beverages.  Learning ways to reduce stress. If lifestyle changes are not enough to get your blood pressure under  control, your health care provider may prescribe medicine. You may need to take more than one. Work closely with your health care provider to understand the risks and benefits. HOME CARE INSTRUCTIONS  Have your blood pressure rechecked as directed by your health care provider.   Take medicines only as directed by your health care provider. Follow the directions carefully. Blood pressure medicines must be taken as prescribed. The medicine does not work as well when you skip doses. Skipping doses also puts you at risk for problems.   Do not smoke.   Monitor your blood pressure at home as directed by your health care provider. SEEK MEDICAL CARE IF:   You think you are having a reaction to medicines taken.  You have recurrent headaches or feel dizzy.  You have swelling in your ankles.  You have trouble with your vision. SEEK IMMEDIATE MEDICAL CARE IF:  You develop a severe headache or confusion.  You have unusual weakness, numbness, or feel faint.  You have severe chest or abdominal pain.  You vomit repeatedly.  You have trouble breathing. MAKE SURE YOU:   Understand these instructions.  Will watch your condition.  Will get help right away if you are not doing well or get worse. Document Released: 11/27/2005 Document Revised: 04/13/2014 Document Reviewed: 09/19/2013 [facility] Patient Information 2015 [place], [place]. This information is not intended to replace advice given to you by your health care  provider. Make sure you discuss any questions you have with your health care provider.

## 2015-04-09 NOTE — Progress Notes (Signed)
   Subjective:    Patient ID: Grace Gomez, female    DOB: 21-Mar-1966, 49 y.o.   MRN: [id number]  Vaginal Discharge The patient's primary symptoms include genital itching, a genital odor and vaginal discharge. The patient's pertinent negatives include no genital lesions or genital rash. This is a recurrent problem. The current episode started 1 to 4 weeks ago. The problem occurs intermittently. The problem has been waxing and waning. Pertinent negatives include no back pain, chills, discolored urine, fever, headaches, rash or sore throat. The vaginal discharge was milky. She has tried antibiotics for the symptoms. The treatment provided mild relief.      Review of Systems  Constitutional: Negative.  Negative for fever and chills.  HENT: Negative.  Negative for sore throat.   Eyes: Negative.   Respiratory: Negative.  Negative for shortness of breath.   Cardiovascular: Negative.  Negative for palpitations.  Gastrointestinal: Negative.   Endocrine: Negative.   Genitourinary: Positive for vaginal discharge.  Musculoskeletal: Negative.  Negative for back pain.  Skin: Negative for rash.  Neurological: Negative.  Negative for headaches.  Hematological: Negative.   Psychiatric/Behavioral: Negative.   All other systems reviewed and are negative.      Objective:   Physical Exam  Constitutional: She is oriented to person, place, and time. She appears well-developed and well-nourished. No distress.  HENT:  Head: Normocephalic and atraumatic.  Right Ear: External ear normal.  Left Ear: External ear normal.  Nose: Nose normal.  Mouth/Throat: Oropharynx is clear and moist.  Eyes: Pupils are equal, round, and reactive to light.  Neck: Normal range of motion. Neck supple. No thyromegaly present.  Cardiovascular: Normal rate, regular rhythm, normal heart sounds and intact distal pulses.   No murmur heard. Pulmonary/Chest: Effort normal and breath sounds normal. No respiratory distress. She has  no wheezes.  Abdominal: Soft. Bowel sounds are normal. She exhibits no distension. There is no tenderness.  Musculoskeletal: Normal range of motion. She exhibits no edema or tenderness.  Neurological: She is alert and oriented to person, place, and time. She has normal reflexes. No cranial nerve deficit.  Skin: Skin is warm and dry.  Psychiatric: She has a normal mood and affect. Her behavior is normal. Judgment and thought content normal.  Vitals reviewed.     BP 141/100 mmHg  Pulse 91  Temp(Src) 97.8 F (36.6 C) (Oral)  Ht _0  (1.6 m)  Wt 190 lb 6.4 oz (86.365 kg)  BMI 33.74 kg/m2     Assessment & Plan:  1. Discharge of vagina - POCT Wet Prep [facility]) - POCT UA - Microscopic Only - POCT urinalysis dipstick  2. BV (bacterial vaginosis) -Keep area clean and dry -Cotton underwear -Tired new antibiotic since this is a recurrence -Finish entire antibiotic - tinidazole (TINDAMAX) 500 MG tablet; Take 4 tablets (2,000 mg total) by mouth daily with breakfast.  Dispense: 8 tablet; Refill: 0  3. Essential hypertension -Pt started on HTCZ 25 mg today -Dash diet information given -Exercise encouraged - Stress Management  -Continue current meds -RTO in 2 weeks - hydrochlorothiazide (HYDRODIURIL) 25 MG tablet; Take 1 tablet (25 mg total) by mouth daily.  Dispense: 90 tablet; Refill: 3 - BMP8+EGFR  Evelina Dun, FNP

## 2015-04-10 LAB — BMP8+EGFR
BUN / CREAT RATIO: 18 (ref 9–23)
BUN: 11 mg/dL (ref 6–24)
CO2: 24 mmol/L (ref 18–29)
Calcium: 9.5 mg/dL (ref 8.7–10.2)
Chloride: 100 mmol/L (ref 97–108)
Creatinine, Ser: 0.61 mg/dL (ref 0.57–1.00)
GFR calc Af Amer: 122 mL/min/{1.73_m2} (ref >59)
GFR, EST NON AFRICAN AMERICAN: 106 mL/min/{1.73_m2} (ref >59)
Glucose: 91 mg/dL (ref 65–99)
Potassium: 4.5 mmol/L (ref 3.5–5.2)
SODIUM: 139 mmol/L (ref 134–144)

## 2015-04-12 ENCOUNTER — Telehealth: Payer: Self-pay | Admitting: Family

## 2015-04-21 ENCOUNTER — Ambulatory Visit (INDEPENDENT_AMBULATORY_CARE_PROVIDER_SITE_OTHER): Payer: BLUE CROSS/BLUE SHIELD | Admitting: Family

## 2015-04-21 ENCOUNTER — Encounter: Payer: Self-pay | Admitting: Family

## 2015-04-21 VITALS — BP 142/90 | HR 85 | Temp 98.2°F | Ht 63.0 in | Wt 190.2 lb

## 2015-04-21 DIAGNOSIS — E559 Vitamin D deficiency, unspecified: Secondary | ICD-10-CM

## 2015-04-21 DIAGNOSIS — E785 Hyperlipidemia, unspecified: Secondary | ICD-10-CM

## 2015-04-21 DIAGNOSIS — K219 Gastro-esophageal reflux disease without esophagitis: Secondary | ICD-10-CM

## 2015-04-21 DIAGNOSIS — Z0000 Encounter for general adult medical examination without abnormal findings: Secondary | ICD-10-CM | POA: Diagnosis not present

## 2015-04-21 DIAGNOSIS — J069 Acute upper respiratory infection, unspecified: Secondary | ICD-10-CM

## 2015-04-21 DIAGNOSIS — Z01419 Encounter for gynecological examination (general) (routine) without abnormal findings: Secondary | ICD-10-CM | POA: Diagnosis not present

## 2015-04-21 DIAGNOSIS — I10 Essential (primary) hypertension: Secondary | ICD-10-CM

## 2015-04-21 LAB — POCT URINALYSIS DIPSTICK
Bilirubin, UA: NEGATIVE
Blood, UA: NEGATIVE
Glucose, UA: NEGATIVE
KETONES UA: NEGATIVE
Leukocytes, UA: NEGATIVE
Nitrite, UA: NEGATIVE
PROTEIN UA: NEGATIVE
Spec Grav, UA: 1.025
UROBILINOGEN UA: NEGATIVE
pH, UA: 6.5

## 2015-04-21 LAB — POCT UA - MICROSCOPIC ONLY
BACTERIA, U MICROSCOPIC: NEGATIVE
CASTS, UR, LPF, POC: NEGATIVE
CRYSTALS, UR, HPF, POC: NEGATIVE
RBC, urine, microscopic: NEGATIVE
Yeast, UA: NEGATIVE

## 2015-04-21 MED ORDER — LISINOPRIL 10 MG PO TABS: 10.0000 mg | ORAL_TABLET | Freq: Every day | ORAL | Status: DC

## 2015-04-21 MED ORDER — METHYLPREDNISOLONE 4 MG PO TBPK
ORAL_TABLET | ORAL | Status: DC
Start: 2015-04-21 — End: 2016-01-06

## 2015-04-21 MED ORDER — HYDROCODONE-HOMATROPINE 5-1.5 MG/5ML PO SYRP: 5.0000 mL | ORAL_SOLUTION | Freq: Three times a day (TID) | ORAL | Status: DC | PRN

## 2015-04-21 MED ORDER — BENZONATATE 200 MG PO CAPS: 200.0000 mg | ORAL_CAPSULE | Freq: Three times a day (TID) | ORAL | Status: DC | PRN

## 2015-04-21 NOTE — Progress Notes (Signed)
Subjective:    Patient ID: Grace Gomez, female    DOB: [birth year], 49 y.o.   MRN: [id number]  Pt presents to the office today for CPE and pap.  Gynecologic Exam Associated symptoms include a sore throat. Pertinent negatives include no fever, headaches or nausea.  Hypertension This is a chronic problem. The current episode started more than 1 year ago. The problem has been waxing and waning since onset. The problem is uncontrolled. Pertinent negatives include no anxiety, headaches, palpitations, peripheral edema or shortness of breath. Risk factors for coronary artery disease include dyslipidemia, family history, obesity and post-menopausal state. Past treatments include diuretics. The current treatment provides mild improvement. There is no history of kidney disease, CAD/MI, CVA, heart failure or a thyroid problem. There is no history of sleep apnea.  Hyperlipidemia This is a chronic problem. The current episode started more than 1 year ago. The problem is uncontrolled. Recent lipid tests were reviewed and are high. She has no history of diabetes. Factors aggravating her hyperlipidemia include fatty foods. Pertinent negatives include no leg pain or shortness of breath. Current antihyperlipidemic treatment includes statins. The current treatment provides moderate improvement of lipids. Risk factors for coronary artery disease include dyslipidemia, family history, hypertension and obesity.  Gastrophageal Reflux She complains of a sore throat. She reports no belching, no coughing, no heartburn, no nausea or no stridor. This is a chronic problem. The current episode started more than 1 year ago. The problem occurs rarely. The problem has been resolved. The symptoms are aggravated by certain foods. Risk factors include obesity. She has tried a PPI for the symptoms. The treatment provided significant relief.  Cough This is a new problem. The current episode started in the past 7 days. The problem has been  gradually worsening. The problem occurs every few minutes. The cough is non-productive. Associated symptoms include postnasal drip and a sore throat. Pertinent negatives include no ear congestion, ear pain, fever, headaches, heartburn, nasal congestion, rhinorrhea or shortness of breath. The symptoms are aggravated by lying down. She has tried OTC cough suppressant and rest for the symptoms. There is no history of asthma or COPD.      Review of Systems  Constitutional: Negative.  Negative for fever.  HENT: Positive for postnasal drip and sore throat. Negative for ear pain and rhinorrhea.   Eyes: Negative.   Respiratory: Negative.  Negative for cough and shortness of breath.   Cardiovascular: Negative.  Negative for palpitations.  Gastrointestinal: Negative.  Negative for heartburn and nausea.  Endocrine: Negative.   Genitourinary: Negative.   Musculoskeletal: Negative.   Neurological: Negative.  Negative for headaches.  Hematological: Negative.   Psychiatric/Behavioral: Negative.   All other systems reviewed and are negative.      Objective:   Physical Exam  Constitutional: She is oriented to person, place, and time. She appears well-developed and well-nourished. No distress.  HENT:  Head: Normocephalic and atraumatic.  Right Ear: External ear normal.  Mouth/Throat: Oropharynx is clear and moist.  Eyes: Pupils are equal, round, and reactive to light.  Neck: Normal range of motion. Neck supple. No thyromegaly present.  Cardiovascular: Normal rate, regular rhythm, normal heart sounds and intact distal pulses.   No murmur heard. Pulmonary/Chest: Effort normal and breath sounds normal. No respiratory distress. She has no wheezes. Right breast exhibits no inverted nipple, no mass, no nipple discharge, no skin change and no tenderness. Left breast exhibits no inverted nipple, no mass, no nipple discharge, no skin change and no  tenderness. Breasts are symmetrical.  Abdominal: Soft. Bowel  sounds are normal. She exhibits no distension. There is no tenderness.  Genitourinary: Vagina normal.  Bimanual exam- no adnexal masses or tenderness, ovaries nonpalpable   Cervix parous and pink- No discharge   Musculoskeletal: Normal range of motion. She exhibits no edema or tenderness.  Neurological: She is alert and oriented to person, place, and time. She has normal reflexes. No cranial nerve deficit.  Skin: Skin is warm and dry.  Psychiatric: She has a normal mood and affect. Her behavior is normal. Judgment and thought content normal.  Vitals reviewed.   BP 142/90 mmHg  Pulse 85  Temp(Src) 98.2 F (36.8 C) (Oral)  Ht $R'5\' 3"'IP$  (1.6 m)  Wt 190 lb 3.2 oz (86.274 kg)  BMI 33.70 kg/m2       Assessment & Plan:  1. Encounter for routine gynecological examination - POCT urinalysis dipstick - POCT UA - Microscopic Only - CMP14+EGFR; Future - Pap IG w/ reflex to HPV when ASC-U  2. Vitamin D deficiency - CMP14+EGFR; Future  3. Gastroesophageal reflux disease, esophagitis presence not specified - CMP14+EGFR; Future  4. Essential hypertension Pt started on Lisinopril 10 mg today -Dash diet information given -Exercise encouraged - Stress Management  -Continue current meds -RTO in 2 weeks  - CMP14+EGFR; Future - lisinopril (PRINIVIL,ZESTRIL) 10 MG tablet; Take 1 tablet (10 mg total) by mouth daily.  Dispense: 90 tablet; Refill: 3  5. Hyperlipidemia - CMP14+EGFR; Future - Lipid panel; Future  6. Annual physical exam - CMP14+EGFR; Future - Lipid panel; Future - Thyroid Panel With TSH; Future - Vit D  25 hydroxy (rtn osteoporosis monitoring); Future - Pap IG w/ reflex to HPV when ASC-U  7. Acute upper respiratory infection -- Take meds as prescribed - Use a cool mist humidifier  -Use saline nose sprays frequently -Saline irrigations of the nose can be very helpful if done frequently.  * 4X daily for 1 week*  * Use of a nettie pot can be helpful with this. Follow  directions with this* -Force fluids -For any cough or congestion  Use plain Mucinex- regular strength or max strength is fine   * Children- consult with Pharmacist for dosing -For fever or aces or pains- take tylenol or ibuprofen appropriate for age and weight.  * for fevers greater than 101 orally you may alternate ibuprofen and tylenol every  3 hours. -Throat lozenges if help - HYDROcodone-homatropine (HYCODAN) 5-1.5 MG/5ML syrup; Take 5 mLs by mouth every 8 (eight) hours as needed for cough.  Dispense: 120 mL; Refill: 0 - benzonatate (TESSALON) 200 MG capsule; Take 1 capsule (200 mg total) by mouth 3 (three) times daily as needed.  Dispense: 30 capsule; Refill: 1 - methylPREDNISolone (MEDROL DOSEPAK) 4 MG TBPK tablet; Use as directed  Dispense: 21 tablet; Refill: 0   Continue all meds Labs pending Health Maintenance reviewed Diet and exercise encouraged RTO 2 weeks to recheck BP  Evelina Dun, FNP

## 2015-04-21 NOTE — Patient Instructions (Addendum)
Health Maintenance Adopting a healthy lifestyle and getting preventive care can go a long way to promote health and wellness. Talk with your health care provider about what schedule of regular examinations is right for you. This is a good chance for you to check in with your provider about disease prevention and staying healthy. In between checkups, there are plenty of things you can do on your own. Experts have done a lot of research about which lifestyle changes and preventive measures are most likely to keep you healthy. Ask your health care provider for more information. WEIGHT AND DIET  Eat a healthy diet  Be sure to include plenty of vegetables, fruits, low-fat dairy products, and lean protein.  Do not eat a lot of foods high in solid fats, added sugars, or salt.  Get regular exercise. This is one of the most important things you can do for your health.  Most adults should exercise for at least 150 minutes each week. The exercise should increase your heart rate and make you sweat (moderate-intensity exercise).  Most adults should also do strengthening exercises at least twice a week. This is in addition to the moderate-intensity exercise.  Maintain a healthy weight  Body mass index (BMI) is a measurement that can be used to identify possible weight problems. It estimates body fat based on height and weight. Your health care provider can help determine your BMI and help you achieve or maintain a healthy weight.  For females 24 years of age and older:   A BMI below 18.5 is considered underweight.  A BMI of 18.5 to 24.9 is normal.  A BMI of 25 to 29.9 is considered overweight.  A BMI of 30 and above is considered obese.  Watch levels of cholesterol and blood lipids  You should start having your blood tested for lipids and cholesterol at 51 years of age, then have this test every 5 years.  You may need to have your cholesterol levels checked more often if:  Your lipid or  cholesterol levels are high.  You are older than 51 years of age.  You are at high risk for heart disease.  CANCER SCREENING   Lung Cancer  Lung cancer screening is recommended for adults 54-46 years old who are at high risk for lung cancer because of a history of smoking.  A yearly low-dose CT scan of the lungs is recommended for people who:  Currently smoke.  Have quit within the past 15 years.  Have at least a 30-pack-year history of smoking. A pack year is smoking an average of one pack of cigarettes a day for 1 year.  Yearly screening should continue until it has been 15 years since you quit.  Yearly screening should stop if you develop a health problem that would prevent you from having lung cancer treatment.  Breast Cancer  Practice breast self-awareness. This means understanding how your breasts normally appear and feel.  It also means doing regular breast self-exams. Let your health care provider know about any changes, no matter how small.  If you are in your 20s or 30s, you should have a clinical breast exam (CBE) by a health care provider every 1-3 years as part of a regular health exam.  If you are 59 or older, have a CBE every year. Also consider having a breast X-ray (mammogram) every year.  If you have a family history of breast cancer, talk to your health care provider about genetic screening.  If you are  at high risk for breast cancer, talk to your health care provider about having an MRI and a mammogram every year.  Breast cancer gene (BRCA) assessment is recommended for women who have family members with BRCA-related cancers. BRCA-related cancers include:  Breast.  Ovarian.  Tubal.  Peritoneal cancers.  Results of the assessment will determine the need for genetic counseling and BRCA1 and BRCA2 testing. Cervical Cancer Routine pelvic examinations to screen for cervical cancer are no longer recommended for nonpregnant women who are considered low  risk for cancer of the pelvic organs (ovaries, uterus, and vagina) and who do not have symptoms. A pelvic examination may be necessary if you have symptoms including those associated with pelvic infections. Ask your health care provider if a screening pelvic exam is right for you.   The Pap test is the screening test for cervical cancer for women who are considered at risk.  If you had a hysterectomy for a problem that was not cancer or a condition that could lead to cancer, then you no longer need Pap tests.  If you are older than 65 years, and you have had normal Pap tests for the past 10 years, you no longer need to have Pap tests.  If you have had past treatment for cervical cancer or a condition that could lead to cancer, you need Pap tests and screening for cancer for at least 20 years after your treatment.  If you no longer get a Pap test, assess your risk factors if they change (such as having a new sexual partner). This can affect whether you should start being screened again.  Some women have medical problems that increase their chance of getting cervical cancer. If this is the case for you, your health care provider may recommend more frequent screening and Pap tests.  The human papillomavirus (HPV) test is another test that may be used for cervical cancer screening. The HPV test looks for the virus that can cause cell changes in the cervix. The cells collected during the Pap test can be tested for HPV.  The HPV test can be used to screen women 30 years of age and older. Getting tested for HPV can extend the interval between normal Pap tests from three to five years.  An HPV test also should be used to screen women of any age who have unclear Pap test results.  After 51 years of age, women should have HPV testing as often as Pap tests.  Colorectal Cancer  This type of cancer can be detected and often prevented.  Routine colorectal cancer screening usually begins at 50 years of  age and continues through 51 years of age.  Your health care provider may recommend screening at an earlier age if you have risk factors for colon cancer.  Your health care provider may also recommend using home test kits to check for hidden blood in the stool.  A small camera at the end of a tube can be used to examine your colon directly (sigmoidoscopy or colonoscopy). This is done to check for the earliest forms of colorectal cancer.  Routine screening usually begins at age 50.  Direct examination of the colon should be repeated every 5-10 years through 51 years of age. However, you may need to be screened more often if early forms of precancerous polyps or small growths are found. Skin Cancer  Check your skin from head to toe regularly.  Tell your health care provider about any new moles or changes in   moles, especially if there is a change in a mole's shape or color.  Also tell your health care provider if you have a mole that is larger than the size of a pencil eraser.  Always use sunscreen. Apply sunscreen liberally and repeatedly throughout the day.  Protect yourself by wearing long sleeves, pants, a wide-brimmed hat, and sunglasses whenever you are outside. HEART DISEASE, DIABETES, AND HIGH BLOOD PRESSURE   Have your blood pressure checked at least every 1-2 years. High blood pressure causes heart disease and increases the risk of stroke.  If you are between 49 years and 48 years old, ask your health care provider if you should take aspirin to prevent strokes.  Have regular diabetes screenings. This involves taking a blood sample to check your fasting blood sugar level.  If you are at a normal weight and have a low risk for diabetes, have this test once every three years after 51 years of age.  If you are overweight and have a high risk for diabetes, consider being tested at a younger age or more often. PREVENTING INFECTION  Hepatitis B  If you have a higher risk for  hepatitis B, you should be screened for this virus. You are considered at high risk for hepatitis B if:  You were born in a country where hepatitis B is common. Ask your health care provider which countries are considered high risk.  Your parents were born in a high-risk country, and you have not been immunized against hepatitis B (hepatitis B vaccine).  You have HIV or AIDS.  You use needles to inject street drugs.  You live with someone who has hepatitis B.  You have had sex with someone who has hepatitis B.  You get hemodialysis treatment.  You take certain medicines for conditions, including cancer, organ transplantation, and autoimmune conditions. Hepatitis C  Blood testing is recommended for:  Everyone born from [birth year] through 1967.  Anyone with known risk factors for hepatitis C. Sexually transmitted infections (STIs)  You should be screened for sexually transmitted infections (STIs) including gonorrhea and chlamydia if:  You are sexually active and are younger than 51 years of age.  You are older than 51 years of age and your health care provider tells you that you are at risk for this type of infection.  Your sexual activity has changed since you were last screened and you are at an increased risk for chlamydia or gonorrhea. Ask your health care provider if you are at risk.  If you do not have HIV, but are at risk, it may be recommended that you take a prescription medicine daily to prevent HIV infection. This is called pre-exposure prophylaxis (PrEP). You are considered at risk if:  You are sexually active and do not regularly use condoms or know the HIV status of your partner(s).  You take drugs by injection.  You are sexually active with a partner who has HIV. Talk with your health care provider about whether you are at high risk of being infected with HIV. If you choose to begin PrEP, you should first be tested for HIV. You should then be tested every 3 months for  as long as you are taking PrEP.  PREGNANCY   If you are premenopausal and you may become pregnant, ask your health care provider about preconception counseling.  If you may become pregnant, take 400 to 800 micrograms (mcg) of folic acid every day.  If you want to prevent pregnancy, talk to your  health care provider about birth control (contraception). OSTEOPOROSIS AND MENOPAUSE   Osteoporosis is a disease in which the bones lose minerals and strength with aging. This can result in serious bone fractures. Your risk for osteoporosis can be identified using a bone density scan.  If you are 38 years of age or older, or if you are at risk for osteoporosis and fractures, ask your health care provider if you should be screened.  Ask your health care provider whether you should take a calcium or vitamin D supplement to lower your risk for osteoporosis.  Menopause may have certain physical symptoms and risks.  Hormone replacement therapy may reduce some of these symptoms and risks. Talk to your health care provider about whether hormone replacement therapy is right for you.  HOME CARE INSTRUCTIONS   Schedule regular health, dental, and eye exams.  Stay current with your immunizations.   Do not use any tobacco products including cigarettes, chewing tobacco, or electronic cigarettes.  If you are pregnant, do not drink alcohol.  If you are breastfeeding, limit how much and how often you drink alcohol.  Limit alcohol intake to no more than 1 drink per day for nonpregnant women. One drink equals 12 ounces of beer, 5 ounces of wine, or 1 ounces of hard liquor.  Do not use street drugs.  Do not share needles.  Ask your health care provider for help if you need support or information about quitting drugs.  Tell your health care provider if you often feel depressed.  Tell your health care provider if you have ever been abused or do not feel safe at home. Document Released: 06/12/2011  Document Revised: 04/13/2014 Document Reviewed: 10/29/2013 [facility] Patient Information 2015 [place], [place]. This information is not intended to replace advice given to you by your health care provider. Make sure you discuss any questions you have with your health care provider. DASH Eating Plan DASH stands for "Dietary Approaches to Stop Hypertension." The DASH eating plan is a healthy eating plan that has been shown to reduce high blood pressure (hypertension). Additional health benefits may include reducing the risk of type 2 diabetes mellitus, heart disease, and stroke. The DASH eating plan may also help with weight loss. WHAT DO I NEED TO KNOW ABOUT THE DASH EATING PLAN? For the DASH eating plan, you will follow these general guidelines:  Choose foods with a percent daily value for sodium of less than 5% (as listed on the food label).  Use salt-free seasonings or herbs instead of table salt or sea salt.  Check with your health care provider or pharmacist before using salt substitutes.  Eat lower-sodium products, often labeled as "lower sodium" or "no salt added."  Eat fresh foods.  Eat more vegetables, fruits, and low-fat dairy products.  Choose whole grains. Look for the word "whole" as the first word in the ingredient list.  Choose fish and skinless chicken or [place] more often than red meat. Limit fish, poultry, and meat to 6 oz (170 g) each day.  Limit sweets, desserts, sugars, and sugary drinks.  Choose heart-healthy fats.  Limit cheese to 1 oz (28 g) per day.  Eat more home-cooked food and less restaurant, buffet, and fast food.  Limit fried foods.  Cook foods using methods other than frying.  Limit canned vegetables. If you do use them, rinse them well to decrease the sodium.  When eating at a restaurant, ask that your food be prepared with less salt, or no salt if possible.  WHAT FOODS CAN I EAT? Seek help from a dietitian for individual calorie  needs. Grains Whole grain or whole wheat bread. Brown rice. Whole grain or whole wheat pasta. Quinoa, bulgur, and whole grain cereals. Low-sodium cereals. Corn or whole wheat flour tortillas. Whole grain cornbread. Whole grain crackers. Low-sodium crackers. Vegetables Fresh or frozen vegetables (raw, steamed, roasted, or grilled). Low-sodium or reduced-sodium tomato and vegetable juices. Low-sodium or reduced-sodium tomato sauce and paste. Low-sodium or reduced-sodium canned vegetables.  Fruits All fresh, canned (in natural juice), or frozen fruits. Meat and Other Protein Products Ground beef (85% or leaner), grass-fed beef, or beef trimmed of fat. Skinless chicken or [place]. Ground chicken or [place]. Pork trimmed of fat. All fish and seafood. Eggs. Dried beans, peas, or lentils. Unsalted nuts and seeds. Unsalted canned beans. Dairy Low-fat dairy products, such as skim or 1% milk, 2% or reduced-fat cheeses, low-fat ricotta or cottage cheese, or plain low-fat yogurt. Low-sodium or reduced-sodium cheeses. Fats and Oils Tub margarines without trans fats. Light or reduced-fat mayonnaise and salad dressings (reduced sodium). Avocado. Safflower, olive, or canola oils. Natural peanut or almond butter. Other Unsalted popcorn and pretzels. The items listed above may not be a complete list of recommended foods or beverages. Contact your dietitian for more options. WHAT FOODS ARE NOT RECOMMENDED? Grains Dokes bread. Bodenheimer pasta. Neyer rice. Refined cornbread. Bagels and croissants. Crackers that contain trans fat. Vegetables Creamed or fried vegetables. Vegetables in a cheese sauce. Regular canned vegetables. Regular canned tomato sauce and paste. Regular tomato and vegetable juices. Fruits Dried fruits. Canned fruit in light or heavy syrup. Fruit juice. Meat and Other Protein Products Fatty cuts of meat. Ribs, chicken wings, bacon, sausage, bologna, salami, chitterlings, fatback, hot dogs, bratwurst,  and packaged luncheon meats. Salted nuts and seeds. Canned beans with salt. Dairy Whole or 2% milk, cream, half-and-half, and cream cheese. Whole-fat or sweetened yogurt. Full-fat cheeses or blue cheese. Nondairy creamers and whipped toppings. Processed cheese, cheese spreads, or cheese curds. Condiments Onion and garlic salt, seasoned salt, table salt, and sea salt. Canned and packaged gravies. Worcestershire sauce. Tartar sauce. Barbecue sauce. Teriyaki sauce. Soy sauce, including reduced sodium. Steak sauce. Fish sauce. Oyster sauce. Cocktail sauce. Horseradish. Ketchup and mustard. Meat flavorings and tenderizers. Bouillon cubes. Hot sauce. Tabasco sauce. Marinades. Taco seasonings. Relishes. Fats and Oils Butter, stick margarine, lard, shortening, ghee, and bacon fat. Coconut, palm kernel, or palm oils. Regular salad dressings. Other Pickles and olives. Salted popcorn and pretzels. The items listed above may not be a complete list of foods and beverages to avoid. Contact your dietitian for more information. WHERE CAN I FIND MORE INFORMATION? National Heart, Lung, and Blood Institute: travelstabloid.com Document Released: 11/16/2011 Document Revised: 04/13/2014 Document Reviewed: 10/01/2013 [facility] Patient Information 2015 [place], [place]. This information is not intended to replace advice given to you by your health care provider. Make sure you discuss any questions you have with your health care provider. Hypertension Hypertension, commonly called high blood pressure, is when the force of blood pumping through your arteries is too strong. Your arteries are the blood vessels that carry blood from your heart throughout your body. A blood pressure reading consists of a higher number over a lower number, such as 110/72. The higher number (systolic) is the pressure inside your arteries when your heart pumps. The lower number (diastolic) is the pressure inside your  arteries when your heart relaxes. Ideally you want your blood pressure below 120/80. Hypertension forces your heart to work harder  to pump blood. Your arteries may become narrow or stiff. Having hypertension puts you at risk for heart disease, stroke, and other problems.  RISK FACTORS Some risk factors for high blood pressure are controllable. Others are not.  Risk factors you cannot control include:   Race. You may be at higher risk if you are African American.  Age. Risk increases with age.  Gender. Men are at higher risk than women before age 34 years. After age 56, women are at higher risk than men. Risk factors you can control include:  Not getting enough exercise or physical activity.  Being overweight.  Getting too much fat, sugar, calories, or salt in your diet.  Drinking too much alcohol. SIGNS AND SYMPTOMS Hypertension does not usually cause signs or symptoms. Extremely high blood pressure (hypertensive crisis) may cause headache, anxiety, shortness of breath, and nosebleed. DIAGNOSIS  To check if you have hypertension, your health care provider will measure your blood pressure while you are seated, with your arm held at the level of your heart. It should be measured at least twice using the same arm. Certain conditions can cause a difference in blood pressure between your right and left arms. A blood pressure reading that is higher than normal on one occasion does not mean that you need treatment. If one blood pressure reading is high, ask your health care provider about having it checked again. TREATMENT  Treating high blood pressure includes making lifestyle changes and possibly taking medicine. Living a healthy lifestyle can help lower high blood pressure. You may need to change some of your habits. Lifestyle changes may include:  Following the DASH diet. This diet is high in fruits, vegetables, and whole grains. It is low in salt, red meat, and added sugars.  Getting at  least 2 hours of brisk physical activity every week.  Losing weight if necessary.  Not smoking.  Limiting alcoholic beverages.  Learning ways to reduce stress. If lifestyle changes are not enough to get your blood pressure under control, your health care provider may prescribe medicine. You may need to take more than one. Work closely with your health care provider to understand the risks and benefits. HOME CARE INSTRUCTIONS  Have your blood pressure rechecked as directed by your health care provider.   Take medicines only as directed by your health care provider. Follow the directions carefully. Blood pressure medicines must be taken as prescribed. The medicine does not work as well when you skip doses. Skipping doses also puts you at risk for problems.   Do not smoke.   Monitor your blood pressure at home as directed by your health care provider. SEEK MEDICAL CARE IF:   You think you are having a reaction to medicines taken.  You have recurrent headaches or feel dizzy.  You have swelling in your ankles.  You have trouble with your vision. SEEK IMMEDIATE MEDICAL CARE IF:  You develop a severe headache or confusion.  You have unusual weakness, numbness, or feel faint.  You have severe chest or abdominal pain.  You vomit repeatedly.  You have trouble breathing. MAKE SURE YOU:   Understand these instructions.  Will watch your condition.  Will get help right away if you are not doing well or get worse. Document Released: 11/27/2005 Document Revised: 04/13/2014 Document Reviewed: 09/19/2013 [facility] Patient Information 2015 [place], [place]. This information is not intended to replace advice given to you by your health care provider. Make sure you discuss any questions you have with  your health care provider.

## 2015-04-23 ENCOUNTER — Other Ambulatory Visit (INDEPENDENT_AMBULATORY_CARE_PROVIDER_SITE_OTHER): Payer: BLUE CROSS/BLUE SHIELD

## 2015-04-23 DIAGNOSIS — K219 Gastro-esophageal reflux disease without esophagitis: Secondary | ICD-10-CM

## 2015-04-23 DIAGNOSIS — E785 Hyperlipidemia, unspecified: Secondary | ICD-10-CM

## 2015-04-23 DIAGNOSIS — Z0000 Encounter for general adult medical examination without abnormal findings: Secondary | ICD-10-CM

## 2015-04-23 DIAGNOSIS — Z01419 Encounter for gynecological examination (general) (routine) without abnormal findings: Secondary | ICD-10-CM | POA: Diagnosis not present

## 2015-04-23 DIAGNOSIS — I10 Essential (primary) hypertension: Secondary | ICD-10-CM

## 2015-04-23 DIAGNOSIS — E559 Vitamin D deficiency, unspecified: Secondary | ICD-10-CM

## 2015-04-23 LAB — PAP IG W/ RFLX HPV ASCU: PAP Smear Comment: 0

## 2015-04-23 NOTE — Progress Notes (Signed)
Lab only 

## 2015-04-24 LAB — CMP14+EGFR
ALBUMIN: 4 g/dL (ref 3.5–5.5)
ALT: 16 IU/L (ref 0–32)
AST: 12 IU/L (ref 0–40)
Albumin/Globulin Ratio: 1.6 (ref 1.1–2.5)
Alkaline Phosphatase: 79 IU/L (ref 39–117)
BUN/Creatinine Ratio: 27 — ABNORMAL HIGH (ref 9–23)
BUN: 16 mg/dL (ref 6–24)
Bilirubin Total: 0.2 mg/dL (ref 0.0–1.2)
CO2: 22 mmol/L (ref 18–29)
CREATININE: 0.59 mg/dL (ref 0.57–1.00)
Calcium: 9.4 mg/dL (ref 8.7–10.2)
Chloride: 102 mmol/L (ref 97–108)
GFR calc Af Amer: 124 mL/min/{1.73_m2} (ref >59)
GFR, EST NON AFRICAN AMERICAN: 107 mL/min/{1.73_m2} (ref >59)
GLOBULIN, TOTAL: 2.5 g/dL (ref 1.5–4.5)
Glucose: 85 mg/dL (ref 65–99)
POTASSIUM: 4 mmol/L (ref 3.5–5.2)
SODIUM: 139 mmol/L (ref 134–144)
Total Protein: 6.5 g/dL (ref 6.0–8.5)

## 2015-04-24 LAB — LIPID PANEL
CHOL/HDL RATIO: 4.4 ratio (ref 0.0–4.4)
CHOLESTEROL TOTAL: 244 mg/dL — AB (ref 100–199)
HDL: 55 mg/dL (ref >39)
LDL Calculated: 152 mg/dL — ABNORMAL HIGH (ref 0–99)
Triglycerides: 187 mg/dL — ABNORMAL HIGH (ref 0–149)
VLDL CHOLESTEROL CAL: 37 mg/dL (ref 5–40)

## 2015-04-24 LAB — THYROID PANEL WITH TSH
Free Thyroxine Index: 2.1 (ref 1.2–4.9)
T3 Uptake Ratio: 27 % (ref 24–39)
T4 TOTAL: 7.8 ug/dL (ref 4.5–12.0)
TSH: 1.69 u[IU]/mL (ref 0.450–4.500)

## 2015-04-24 LAB — VITAMIN D 25 HYDROXY (VIT D DEFICIENCY, FRACTURES): VIT D 25 HYDROXY: 28.7 ng/mL — AB (ref 30.0–100.0)

## 2015-04-26 ENCOUNTER — Other Ambulatory Visit: Payer: Self-pay | Admitting: Family

## 2015-04-26 ENCOUNTER — Telehealth: Payer: Self-pay | Admitting: *Deleted

## 2015-04-26 DIAGNOSIS — R87612 Low grade squamous intraepithelial lesion on cytologic smear of cervix (LGSIL): Secondary | ICD-10-CM

## 2015-04-26 MED ORDER — VITAMIN D (ERGOCALCIFEROL) 1.25 MG (50000 UNIT) PO CAPS: 50000.0000 [IU] | ORAL_CAPSULE | ORAL | Status: DC

## 2015-04-26 NOTE — Telephone Encounter (Signed)
-----   Message from Sharion Balloon, [place] sent at 04/26/2015 11:25 AM EDT ----- Urine negative for UTI Pap- Abnormal cells presents- Colposcopy ordered

## 2015-04-26 NOTE — Telephone Encounter (Signed)
lmtcb regarding test results. 

## 2015-04-26 NOTE — Telephone Encounter (Signed)
-----   Message from Sharion Balloon, [place] sent at 04/26/2015  8:24 AM EDT ----- Kidney and liver function stable Cholesterol levels elevated- Pt needs to be on low fat diet and continue Crestor- If still elevated may need to increase to 40 mg Thyroid levels WNL Vit D levels low-Continue Vit D- New Prescription sent to pharmacy

## 2015-04-27 ENCOUNTER — Telehealth: Payer: Self-pay | Admitting: Family

## 2015-04-27 DIAGNOSIS — R87629 Unspecified abnormal cytological findings in specimens from vagina: Secondary | ICD-10-CM

## 2015-04-27 NOTE — Telephone Encounter (Signed)
Pt notifed of results Verbalizes understanding

## 2015-04-29 ENCOUNTER — Telehealth: Payer: Self-pay | Admitting: Family

## 2015-04-29 NOTE — Telephone Encounter (Signed)
Pt notified of results Verbalizes understanding 

## 2015-11-22 ENCOUNTER — Other Ambulatory Visit: Payer: Self-pay | Admitting: Family

## 2015-12-14 LAB — HM MAMMOGRAPHY

## 2015-12-17 ENCOUNTER — Encounter: Payer: Self-pay | Admitting: *Deleted

## 2016-01-04 ENCOUNTER — Other Ambulatory Visit: Payer: Self-pay | Admitting: Family

## 2016-01-06 ENCOUNTER — Encounter: Payer: Self-pay | Admitting: Family

## 2016-01-06 ENCOUNTER — Ambulatory Visit (INDEPENDENT_AMBULATORY_CARE_PROVIDER_SITE_OTHER): Payer: BLUE CROSS/BLUE SHIELD | Admitting: Family

## 2016-01-06 VITALS — BP 134/87 | HR 83 | Temp 97.2°F | Ht 63.0 in | Wt 194.6 lb

## 2016-01-06 DIAGNOSIS — N898 Other specified noninflammatory disorders of vagina: Secondary | ICD-10-CM

## 2016-01-06 DIAGNOSIS — E349 Endocrine disorder, unspecified: Secondary | ICD-10-CM | POA: Diagnosis not present

## 2016-01-06 DIAGNOSIS — N644 Mastodynia: Secondary | ICD-10-CM | POA: Diagnosis not present

## 2016-01-06 DIAGNOSIS — N3000 Acute cystitis without hematuria: Secondary | ICD-10-CM | POA: Diagnosis not present

## 2016-01-06 LAB — POCT URINALYSIS DIPSTICK
Bilirubin, UA: NEGATIVE
GLUCOSE UA: NEGATIVE
Ketones, UA: NEGATIVE
Nitrite, UA: NEGATIVE
Protein, UA: NEGATIVE
RBC UA: NEGATIVE
Spec Grav, UA: 1.02
UROBILINOGEN UA: NEGATIVE
pH, UA: 5

## 2016-01-06 LAB — POCT WET PREP (WET MOUNT)

## 2016-01-06 LAB — POCT UA - MICROSCOPIC ONLY
CASTS, UR, LPF, POC: NEGATIVE
CRYSTALS, UR, HPF, POC: NEGATIVE
Mucus, UA: NEGATIVE
YEAST UA: NEGATIVE

## 2016-01-06 MED ORDER — NITROFURANTOIN MONOHYD MACRO 100 MG PO CAPS: 100.0000 mg | ORAL_CAPSULE | Freq: Two times a day (BID) | ORAL | Status: DC

## 2016-01-06 NOTE — Patient Instructions (Signed)
Menopause Menopause is the normal time of life when menstrual periods stop completely. Menopause is complete when you have missed 12 consecutive menstrual periods. It usually occurs between the ages of 48 years and 55 years. Very rarely does a woman develop menopause before the age of 40 years. At menopause, your ovaries stop producing the female hormones estrogen and progesterone. This can cause undesirable symptoms and also affect your health. Sometimes the symptoms may occur 4-5 years before the menopause begins. There is no relationship between menopause and:  Oral contraceptives.  Number of children you had.  Race.  The age your menstrual periods started (menarche). Heavy smokers and very thin women may develop menopause earlier in life. CAUSES  The ovaries stop producing the female hormones estrogen and progesterone.  Other causes include:  Surgery to remove both ovaries.  The ovaries stop functioning for no known reason.  Tumors of the pituitary gland in the brain.  Medical disease that affects the ovaries and hormone production.  Radiation treatment to the abdomen or pelvis.  Chemotherapy that affects the ovaries. SYMPTOMS   Hot flashes.  Night sweats.  Decrease in sex drive.  Vaginal dryness and thinning of the vagina causing painful intercourse.  Dryness of the skin and developing wrinkles.  Headaches.  Tiredness.  Irritability.  Memory problems.  Weight gain.  Bladder infections.  Hair growth of the face and chest.  Infertility. More serious symptoms include:  Loss of bone (osteoporosis) causing breaks (fractures).  Depression.  Hardening and narrowing of the arteries (atherosclerosis) causing heart attacks and strokes. DIAGNOSIS   When the menstrual periods have stopped for 12 straight months.  Physical exam.  Hormone studies of the blood. TREATMENT  There are many treatment choices and nearly as many questions about them. The  decisions to treat or not to treat menopausal changes is an individual choice made with your health care provider. Your health care provider can discuss the treatments with you. Together, you can decide which treatment will work best for you. Your treatment choices may include:   Hormone therapy (estrogen and progesterone).  Non-hormonal medicines.  Treating the individual symptoms with medicine (for example antidepressants for depression).  Herbal medicines that may help specific symptoms.  Counseling by a psychiatrist or psychologist.  Group therapy.  Lifestyle changes including:  Eating healthy.  Regular exercise.  Limiting caffeine and alcohol.  Stress management and meditation.  No treatment. HOME CARE INSTRUCTIONS   Take the medicine your health care provider gives you as directed.  Get plenty of sleep and rest.  Exercise regularly.  Eat a diet that contains calcium (good for the bones) and soy products (acts like estrogen hormone).  Avoid alcoholic beverages.  Do not smoke.  If you have hot flashes, dress in layers.  Take supplements, calcium, and vitamin D to strengthen bones.  You can use over-the-counter lubricants or moisturizers for vaginal dryness.  Group therapy is sometimes very helpful.  Acupuncture may be helpful in some cases. SEEK MEDICAL CARE IF:   You are not sure you are in menopause.  You are having menopausal symptoms and need advice and treatment.  You are still having menstrual periods after age 55 years.  You have pain with intercourse.  Menopause is complete (no menstrual period for 12 months) and you develop vaginal bleeding.  You need a referral to a specialist (gynecologist, psychiatrist, or psychologist) for treatment. SEEK IMMEDIATE MEDICAL CARE IF:   You have severe depression.  You have excessive vaginal bleeding.    You fell and think you have a broken bone.  You have pain when you urinate.  You develop leg or  chest pain.  You have a fast pounding heart beat (palpitations).  You have severe headaches.  You develop vision problems.  You feel a lump in your breast.  You have abdominal pain or severe indigestion.   This information is not intended to replace advice given to you by your health care provider. Make sure you discuss any questions you have with your health care provider.   Document Released: 02/17/2004 Document Revised: 07/30/2013 Document Reviewed: 06/26/2013 Elsevier Interactive Patient Education 2016 [employer] Inc.  

## 2016-01-06 NOTE — Progress Notes (Signed)
   Subjective:    Patient ID: Grace Gomez, female    DOB: 13-Aug-1966, 50 y.o.   MRN: [id number]  Vaginal Discharge The patient's primary symptoms include genital itching and vaginal discharge. The patient's pertinent negatives include no genital odor, genital rash or missed menses. This is a new problem. The current episode started 1 to 4 weeks ago. The problem occurs intermittently. The problem has been gradually worsening. The patient is experiencing no pain. Pertinent negatives include no anorexia, back pain, chills, diarrhea, discolored urine, flank pain, frequency, headaches, painful intercourse, sore throat, urgency or vomiting. The vaginal discharge was clear. Nothing aggravates the symptoms. She has tried nothing for the symptoms. The treatment provided no relief.   *Pt states her last menstrual cycle was 12/27/15. PT is complaining of bilateral breast tenderness. PT states she had a negative mammogram on 12/15/15.    Review of Systems  Constitutional: Negative.  Negative for chills.  HENT: Negative.  Negative for sore throat.   Eyes: Negative.   Respiratory: Negative.  Negative for shortness of breath.   Cardiovascular: Negative.  Negative for palpitations.  Gastrointestinal: Negative.  Negative for vomiting, diarrhea and anorexia.  Endocrine: Negative.   Genitourinary: Positive for vaginal discharge. Negative for urgency, frequency, flank pain and missed menses.  Musculoskeletal: Negative.  Negative for back pain.  Neurological: Negative.  Negative for headaches.  Hematological: Negative.   Psychiatric/Behavioral: Negative.   All other systems reviewed and are negative.      Objective:   Physical Exam  Constitutional: She is oriented to person, place, and time. She appears well-developed and well-nourished. No distress.  Eyes: Pupils are equal, round, and reactive to light.  Neck: Normal range of motion. Neck supple. No thyromegaly present.  Cardiovascular: Normal rate,  regular rhythm, normal heart sounds and intact distal pulses.   No murmur heard. Pulmonary/Chest: Effort normal and breath sounds normal. No respiratory distress. She has no wheezes. Right breast exhibits no inverted nipple, no mass, no nipple discharge, no skin change and no tenderness. Left breast exhibits no inverted nipple, no mass, no nipple discharge, no skin change and no tenderness. Breasts are symmetrical.  Abdominal: Soft. Bowel sounds are normal. She exhibits no distension. There is no tenderness.  Musculoskeletal: Normal range of motion. She exhibits no edema or tenderness.  Neurological: She is alert and oriented to person, place, and time. She has normal reflexes. No cranial nerve deficit.  Skin: Skin is warm and dry.  Psychiatric: She has a normal mood and affect. Her behavior is normal. Judgment and thought content normal.  Vitals reviewed.   BP 134/87 mmHg  Pulse 83  Temp(Src) 97.2 F (36.2 C) (Oral)  Ht 5\' 3"  (1.6 m)  Wt 194 lb 9.6 oz (88.27 kg)  BMI 34.48 kg/m2       Assessment & Plan:  1. Vaginal discharge - POCT Wet Prep [facility]) - POCT urinalysis dipstick - POCT UA - Microscopic Only  2. Hormone imbalance -Discussed in length menopausal symptoms  3. Breast tenderness -Continue with regular mammograms  4. Acute cystitis without hematuria -Force fluids AZO over the counter X2 days RTO prn - nitrofurantoin, macrocrystal-monohydrate, (MACROBID) 100 MG capsule; Take 1 capsule (100 mg total) by mouth 2 (two) times daily. 1 po BId  Dispense: 14 capsule; Refill: 0   Evelina Dun, FNP

## 2016-01-25 ENCOUNTER — Ambulatory Visit (INDEPENDENT_AMBULATORY_CARE_PROVIDER_SITE_OTHER): Payer: BLUE CROSS/BLUE SHIELD | Admitting: Family

## 2016-01-25 ENCOUNTER — Encounter: Payer: Self-pay | Admitting: Family

## 2016-01-25 VITALS — BP 150/87 | HR 84 | Temp 98.7°F | Ht 63.0 in | Wt 193.0 lb

## 2016-01-25 DIAGNOSIS — N898 Other specified noninflammatory disorders of vagina: Secondary | ICD-10-CM

## 2016-01-25 DIAGNOSIS — J309 Allergic rhinitis, unspecified: Secondary | ICD-10-CM | POA: Diagnosis not present

## 2016-01-25 DIAGNOSIS — B372 Candidiasis of skin and nail: Secondary | ICD-10-CM

## 2016-01-25 LAB — POCT WET PREP (WET MOUNT)

## 2016-01-25 MED ORDER — FLUTICASONE PROPIONATE 50 MCG/ACT NA SUSP: 2.0000 | Freq: Every day | NASAL | Status: DC

## 2016-01-25 MED ORDER — NYSTATIN-TRIAMCINOLONE 100000-0.1 UNIT/GM-% EX OINT: 1.0000 "application " | TOPICAL_OINTMENT | Freq: Two times a day (BID) | CUTANEOUS | Status: DC

## 2016-01-25 MED ORDER — NYSTATIN 100000 UNIT/GM EX POWD
CUTANEOUS | Status: DC
Start: 2016-01-25 — End: 2016-06-02

## 2016-01-25 NOTE — Progress Notes (Signed)
Subjective:    Patient ID: Grace Gomez, female    DOB: 1966/04/15, 50 y.o.   MRN: [id number]  Otalgia  There is pain in the right ear. This is a new problem. The current episode started 1 to 4 weeks ago. The problem has been unchanged. The pain is at a severity of 7/10. The pain is moderate. Associated symptoms include neck pain and rhinorrhea. Pertinent negatives include no coughing, diarrhea, ear discharge, headaches, hearing loss, sore throat or vomiting. She has tried ear drops for the symptoms. The treatment provided mild relief.  Vaginal Itching The patient's primary symptoms include genital itching. The patient's pertinent negatives include no genital lesions, genital odor, pelvic pain or vaginal discharge. This is a new problem. The current episode started in the past 7 days. The problem occurs constantly. The problem has been unchanged. The patient is experiencing no pain. Pertinent negatives include no constipation, diarrhea, fever, flank pain, headaches, hematuria, joint pain, nausea, sore throat or vomiting. She has tried antifungals for the symptoms. The treatment provided mild relief.      Review of Systems  Constitutional: Negative.  Negative for fever.  HENT: Positive for ear pain and rhinorrhea. Negative for ear discharge, hearing loss and sore throat.   Eyes: Negative.   Respiratory: Negative.  Negative for cough and shortness of breath.   Cardiovascular: Negative.  Negative for palpitations.  Gastrointestinal: Negative.  Negative for nausea, vomiting, diarrhea and constipation.  Endocrine: Negative.   Genitourinary: Negative.  Negative for hematuria, flank pain, vaginal discharge and pelvic pain.  Musculoskeletal: Positive for neck pain. Negative for joint pain.  Neurological: Negative.  Negative for headaches.  Hematological: Negative.   Psychiatric/Behavioral: Negative.   All other systems reviewed and are negative.      Objective:   Physical Exam    Constitutional: She is oriented to person, place, and time. She appears well-developed and well-nourished. No distress.  HENT:  Head: Normocephalic and atraumatic.  Right Ear: External ear normal.  Left Ear: External ear normal.  Nasal passage erythemas with mild swelling  Oropharynx erythemas  Eyes: Pupils are equal, round, and reactive to light.  Neck: Normal range of motion. Neck supple. No thyromegaly present.  Cardiovascular: Normal rate, regular rhythm, normal heart sounds and intact distal pulses.   No murmur heard. Pulmonary/Chest: Effort normal and breath sounds normal. No respiratory distress. She has no wheezes.  Abdominal: Soft. Bowel sounds are normal. She exhibits no distension. There is no tenderness.  Musculoskeletal: Normal range of motion. She exhibits no edema or tenderness.  Neurological: She is alert and oriented to person, place, and time. She has normal reflexes. No cranial nerve deficit.  Skin: Skin is warm and dry. There is erythema (yeast rash in groin).  Psychiatric: She has a normal mood and affect. Her behavior is normal. Judgment and thought content normal.  Vitals reviewed.     BP 150/87 mmHg  Pulse 84  Temp(Src) 98.7 F (37.1 C) (Oral)  Ht 5\' 3"  (1.6 m)  Wt 193 lb (87.544 kg)  BMI 34.20 kg/m2     Assessment & Plan:  1. Vaginal irritation - POCT Wet Prep [facility])  2. Yeast infection of the skin -Keep clean and dry -Cotton underwear -RTO prn - nystatin (MYCOSTATIN/NYSTOP) 100000 UNIT/GM POWD; Apply to groin BID  Dispense: 60 g; Refill: 1 - nystatin-triamcinolone ointment (MYCOLOG); Apply 1 application topically 2 (two) times daily.  Dispense: 30 g; Refill: 0  3. Allergic rhinitis, unspecified allergic rhinitis type -Avoid  allergens when possible - fluticasone (FLONASE) 50 MCG/ACT nasal spray; Place 2 sprays into both nostrils daily.  Dispense: 16 g; Refill: [place], FNP

## 2016-01-25 NOTE — Patient Instructions (Signed)
Cutaneous Candidiasis °Cutaneous candidiasis is a condition in which there is an overgrowth of yeast (candida) on the skin. Yeast normally live on the skin, but in small enough numbers not to cause any symptoms. In certain cases, increased growth of the yeast may cause an actual yeast infection. This kind of infection usually occurs in areas of the skin that are constantly warm and moist, such as the armpits or the groin. Yeast is the most common cause of diaper rash in babies and in people who cannot control their bowel movements (incontinence). °CAUSES  °The fungus that most often causes cutaneous candidiasis is Candida albicans. Conditions that can increase the risk of getting a yeast infection of the skin include: °· Obesity. °· Pregnancy. °· Diabetes. °· Taking antibiotic medicine. °· Taking birth control pills. °· Taking steroid medicines. °· Thyroid disease. °· An iron or zinc deficiency. °· Problems with the immune system. °SYMPTOMS  °· Red, swollen area of the skin. °· Bumps on the skin. °· Itchiness. °DIAGNOSIS  °The diagnosis of cutaneous candidiasis is usually based on its appearance. Light scrapings of the skin may also be taken and viewed under a microscope to identify the presence of yeast. °TREATMENT  °Antifungal creams may be applied to the infected skin. In severe cases, oral medicines may be needed.  °HOME CARE INSTRUCTIONS  °· Keep your skin clean and dry. °· Maintain a healthy weight. °· If you have diabetes, keep your blood sugar under control. °SEEK IMMEDIATE MEDICAL CARE IF: °· Your rash continues to spread despite treatment. °· You have a fever, chills, or abdominal pain. °  °This information is not intended to replace advice given to you by your health care provider. Make sure you discuss any questions you have with your health care provider. °  °Document Released: 08/15/2011 Document Revised: 02/19/2012 Document Reviewed: 05/31/2015 °Elsevier Interactive Patient Education ©2016 Elsevier  Inc. ° °

## 2016-02-08 ENCOUNTER — Encounter: Payer: Self-pay | Admitting: Family

## 2016-02-08 ENCOUNTER — Ambulatory Visit (INDEPENDENT_AMBULATORY_CARE_PROVIDER_SITE_OTHER): Payer: BLUE CROSS/BLUE SHIELD | Admitting: Family

## 2016-02-08 VITALS — BP 139/87 | HR 81 | Temp 97.6°F | Ht 63.0 in | Wt 192.0 lb

## 2016-02-08 DIAGNOSIS — S134XXA Sprain of ligaments of cervical spine, initial encounter: Secondary | ICD-10-CM | POA: Diagnosis not present

## 2016-02-08 DIAGNOSIS — S139XXA Sprain of joints and ligaments of unspecified parts of neck, initial encounter: Secondary | ICD-10-CM

## 2016-02-08 MED ORDER — CYCLOBENZAPRINE HCL 5 MG PO TABS: 5.0000 mg | ORAL_TABLET | Freq: Three times a day (TID) | ORAL | Status: DC | PRN

## 2016-02-08 MED ORDER — PREDNISONE 10 MG (21) PO TBPK: 10.0000 mg | ORAL_TABLET | Freq: Every day | ORAL | Status: DC

## 2016-02-08 MED ORDER — NAPROXEN 500 MG PO TABS: 500.0000 mg | ORAL_TABLET | Freq: Two times a day (BID) | ORAL | Status: DC

## 2016-02-08 NOTE — Progress Notes (Signed)
   Subjective:    Patient ID: Grace Gomez, female    DOB: 12-Jan-1966, 50 y.o.   MRN: [id number]  Neck Pain  This is a new problem. The current episode started in the past 7 days. The problem occurs constantly. The problem has been unchanged. The pain is associated with nothing. The pain is present in the left side. The quality of the pain is described as aching. The pain is at a severity of 8/10. The pain is moderate. The symptoms are aggravated by position. Associated symptoms include headaches. Pertinent negatives include no fever, numbness, photophobia or visual change. She has tried acetaminophen and NSAIDs for the symptoms. The treatment provided mild relief.      Review of Systems  Constitutional: Negative.  Negative for fever.  Eyes: Negative.  Negative for photophobia.  Respiratory: Negative.  Negative for shortness of breath.   Cardiovascular: Negative.  Negative for palpitations.  Gastrointestinal: Negative.   Endocrine: Negative.   Genitourinary: Negative.   Musculoskeletal: Positive for neck pain.  Neurological: Positive for headaches. Negative for numbness.  Hematological: Negative.   Psychiatric/Behavioral: Negative.   All other systems reviewed and are negative.      Objective:   Physical Exam  Constitutional: She is oriented to person, place, and time. She appears well-developed and well-nourished. No distress.  HENT:  Head: Normocephalic and atraumatic.  Eyes: Pupils are equal, round, and reactive to light.  Neck: Normal range of motion. Neck supple. No thyromegaly present.  Cardiovascular: Normal rate, regular rhythm, normal heart sounds and intact distal pulses.   No murmur heard. Pulmonary/Chest: Effort normal and breath sounds normal. No respiratory distress. She has no wheezes.  Abdominal: Soft. Bowel sounds are normal. She exhibits no distension. There is no tenderness.  Musculoskeletal: Normal range of motion. She exhibits no edema or tenderness.    Neurological: She is alert and oriented to person, place, and time.  Skin: Skin is warm and dry.  Psychiatric: She has a normal mood and affect. Her behavior is normal. Judgment and thought content normal.  Vitals reviewed.     BP 139/87 mmHg  Pulse 81  Temp(Src) 97.6 F (36.4 C) (Oral)  Ht 5\' 3"  (1.6 m)  Wt 192 lb (87.091 kg)  BMI 34.02 kg/m2     Assessment & Plan:  1. Cervical sprain, initial encounter -Rest -Ice and heat as needed -ROM exercises encouraged -RTO prn  - cyclobenzaprine (FLEXERIL) 5 MG tablet; Take 1 tablet (5 mg total) by mouth 3 (three) times daily as needed for muscle spasms.  Dispense: 30 tablet; Refill: 0 - naproxen (NAPROSYN) 500 MG tablet; Take 1 tablet (500 mg total) by mouth 2 (two) times daily with a meal.  Dispense: 60 tablet; Refill: 1 - predniSONE (STERAPRED UNI-PAK 21 TAB) 10 MG (21) TBPK tablet; Take 1 tablet (10 mg total) by mouth daily. As directed x 6 days  Dispense: 21 tablet; Refill: 0  Evelina Dun, FNP

## 2016-02-08 NOTE — Patient Instructions (Signed)
Cervical Sprain  A cervical sprain is an injury in the neck in which the strong, fibrous tissues (ligaments) that connect your neck bones stretch or tear. Cervical sprains can range from mild to severe. Severe cervical sprains can cause the neck vertebrae to be unstable. This can lead to damage of the spinal cord and can result in serious nervous system problems. The amount of time it takes for a cervical sprain to get better depends on the cause and extent of the injury. Most cervical sprains heal in 1 to 3 weeks.  CAUSES   Severe cervical sprains may be caused by:    Contact sport injuries (such as from football, rugby, wrestling, hockey, auto racing, gymnastics, diving, martial arts, or boxing).    Motor vehicle collisions.    Whiplash injuries. This is an injury from a sudden forward and backward whipping movement of the head and neck.   Falls.   Mild cervical sprains may be caused by:    Being in an awkward position, such as while cradling a telephone between your ear and shoulder.    Sitting in a chair that does not offer proper support.    Working at a poorly designed computer station.    Looking up or down for long periods of time.   SYMPTOMS    Pain, soreness, stiffness, or a burning sensation in the front, back, or sides of the neck. This discomfort may develop immediately after the injury or slowly, 24 hours or more after the injury.    Pain or tenderness directly in the middle of the back of the neck.    Shoulder or upper back pain.    Limited ability to move the neck.    Headache.    Dizziness.    Weakness, numbness, or tingling in the hands or arms.    Muscle spasms.    Difficulty swallowing or chewing.    Tenderness and swelling of the neck.   DIAGNOSIS   Most of the time your health care provider can diagnose a cervical sprain by taking your history and doing a physical exam. Your health care provider will ask about previous neck injuries and any known neck  problems, such as arthritis in the neck. X-rays may be taken to find out if there are any other problems, such as with the bones of the neck. Other tests, such as a CT scan or MRI, may also be needed.   TREATMENT   Treatment depends on the severity of the cervical sprain. Mild sprains can be treated with rest, keeping the neck in place (immobilization), and pain medicines. Severe cervical sprains are immediately immobilized. Further treatment is done to help with pain, muscle spasms, and other symptoms and may include:   Medicines, such as pain relievers, numbing medicines, or muscle relaxants.    Physical therapy. This may involve stretching exercises, strengthening exercises, and posture training. Exercises and improved posture can help stabilize the neck, strengthen muscles, and help stop symptoms from returning.   HOME CARE INSTRUCTIONS    Put ice on the injured area.     Put ice in a plastic bag.     Place a towel between your skin and the bag.     Leave the ice on for 15-20 minutes, 3-4 times a day.    If your injury was severe, you may have been given a cervical collar to wear. A cervical collar is a two-piece collar designed to keep your neck from moving while it heals.      Do not remove the collar unless instructed by your health care provider.    If you have long hair, keep it outside of the collar.    Ask your health care provider before making any adjustments to your collar. Minor adjustments may be required over time to improve comfort and reduce pressure on your chin or on the back of your head.    Ifyou are allowed to remove the collar for cleaning or bathing, follow your health care provider's instructions on how to do so safely.    Keep your collar clean by wiping it with mild soap and water and drying it completely. If the collar you have been given includes removable pads, remove them every 1-2 days and hand wash them with soap and water. Allow them to air dry. They should be completely  dry before you wear them in the collar.    If you are allowed to remove the collar for cleaning and bathing, wash and dry the skin of your neck. Check your skin for irritation or sores. If you see any, tell your health care provider.    Do not drive while wearing the collar.    Only take over-the-counter or prescription medicines for pain, discomfort, or fever as directed by your health care provider.    Keep all follow-up appointments as directed by your health care provider.    Keep all physical therapy appointments as directed by your health care provider.    Make any needed adjustments to your workstation to promote good posture.    Avoid positions and activities that make your symptoms worse.    Warm up and stretch before being active to help prevent problems.   SEEK MEDICAL CARE IF:    Your pain is not controlled with medicine.    You are unable to decrease your pain medicine over time as planned.    Your activity level is not improving as expected.   SEEK IMMEDIATE MEDICAL CARE IF:    You develop any bleeding.   You develop stomach upset.   You have signs of an allergic reaction to your medicine.    Your symptoms get worse.    You develop new, unexplained symptoms.    You have numbness, tingling, weakness, or paralysis in any part of your body.   MAKE SURE YOU:    Understand these instructions.   Will watch your condition.   Will get help right away if you are not doing well or get worse.     This information is not intended to replace advice given to you by your health care provider. Make sure you discuss any questions you have with your health care provider.     Document Released: 09/24/2007 Document Revised: 12/02/2013 Document Reviewed: 06/04/2013  Elsevier Interactive Patient Education 2016 [employer] Inc[employer]

## 2016-02-22 ENCOUNTER — Other Ambulatory Visit: Payer: Self-pay | Admitting: Family

## 2016-05-03 DIAGNOSIS — E559 Vitamin D deficiency, unspecified: Secondary | ICD-10-CM | POA: Diagnosis not present

## 2016-05-03 DIAGNOSIS — E782 Mixed hyperlipidemia: Secondary | ICD-10-CM | POA: Diagnosis not present

## 2016-05-03 DIAGNOSIS — E669 Obesity, unspecified: Secondary | ICD-10-CM | POA: Diagnosis not present

## 2016-05-03 DIAGNOSIS — R7301 Impaired fasting glucose: Secondary | ICD-10-CM | POA: Diagnosis not present

## 2016-05-05 ENCOUNTER — Encounter (INDEPENDENT_AMBULATORY_CARE_PROVIDER_SITE_OTHER): Payer: Self-pay

## 2016-05-10 DIAGNOSIS — J069 Acute upper respiratory infection, unspecified: Secondary | ICD-10-CM | POA: Diagnosis not present

## 2016-05-12 DIAGNOSIS — J45909 Unspecified asthma, uncomplicated: Secondary | ICD-10-CM | POA: Diagnosis not present

## 2016-05-12 DIAGNOSIS — J069 Acute upper respiratory infection, unspecified: Secondary | ICD-10-CM | POA: Diagnosis not present

## 2016-05-14 DIAGNOSIS — R062 Wheezing: Secondary | ICD-10-CM | POA: Diagnosis not present

## 2016-05-14 DIAGNOSIS — Z79899 Other long term (current) drug therapy: Secondary | ICD-10-CM | POA: Diagnosis not present

## 2016-05-14 DIAGNOSIS — K219 Gastro-esophageal reflux disease without esophagitis: Secondary | ICD-10-CM | POA: Diagnosis not present

## 2016-05-14 DIAGNOSIS — R0981 Nasal congestion: Secondary | ICD-10-CM | POA: Diagnosis not present

## 2016-05-14 DIAGNOSIS — R05 Cough: Secondary | ICD-10-CM | POA: Diagnosis not present

## 2016-05-17 DIAGNOSIS — E782 Mixed hyperlipidemia: Secondary | ICD-10-CM | POA: Diagnosis not present

## 2016-05-17 DIAGNOSIS — E669 Obesity, unspecified: Secondary | ICD-10-CM | POA: Diagnosis not present

## 2016-05-17 DIAGNOSIS — E559 Vitamin D deficiency, unspecified: Secondary | ICD-10-CM | POA: Diagnosis not present

## 2016-05-17 DIAGNOSIS — Z008 Encounter for other general examination: Secondary | ICD-10-CM | POA: Diagnosis not present

## 2016-05-31 DIAGNOSIS — E669 Obesity, unspecified: Secondary | ICD-10-CM | POA: Diagnosis not present

## 2016-05-31 DIAGNOSIS — Z6833 Body mass index (BMI) 33.0-33.9, adult: Secondary | ICD-10-CM | POA: Diagnosis not present

## 2016-06-02 ENCOUNTER — Ambulatory Visit (INDEPENDENT_AMBULATORY_CARE_PROVIDER_SITE_OTHER): Payer: BLUE CROSS/BLUE SHIELD | Admitting: Family

## 2016-06-02 ENCOUNTER — Encounter: Payer: Self-pay | Admitting: Family

## 2016-06-02 VITALS — BP 146/85 | HR 86 | Temp 98.4°F | Wt 190.2 lb

## 2016-06-02 DIAGNOSIS — S139XXA Sprain of joints and ligaments of unspecified parts of neck, initial encounter: Secondary | ICD-10-CM

## 2016-06-02 DIAGNOSIS — L0291 Cutaneous abscess, unspecified: Secondary | ICD-10-CM | POA: Diagnosis not present

## 2016-06-02 MED ORDER — CYCLOBENZAPRINE HCL 5 MG PO TABS: 5.0000 mg | ORAL_TABLET | Freq: Three times a day (TID) | ORAL | Status: DC | PRN

## 2016-06-02 MED ORDER — AMOXICILLIN-POT CLAVULANATE 875-125 MG PO TABS: 1.0000 | ORAL_TABLET | Freq: Two times a day (BID) | ORAL | Status: DC

## 2016-06-02 NOTE — Progress Notes (Signed)
   Subjective:    Patient ID: Grace Gomez, female    DOB: Apr 24, 1966, 50 y.o.   MRN: [id number]  HPI PT presents to the office for an abscess on her left groin. PT states she noticed it about a week, and it gradually has gotten better. PT states she has tried to squeeze it, but no drainage.    Review of Systems  Constitutional: Negative.   HENT: Negative.   Eyes: Negative.   Respiratory: Negative.  Negative for shortness of breath.   Cardiovascular: Negative.  Negative for palpitations.  Gastrointestinal: Negative.   Endocrine: Negative.   Genitourinary: Negative.   Musculoskeletal: Negative.   Neurological: Negative.  Negative for headaches.  Hematological: Negative.   Psychiatric/Behavioral: Negative.   All other systems reviewed and are negative.      Objective:   Physical Exam  Constitutional: She is oriented to person, place, and time. She appears well-developed and well-nourished. No distress.  HENT:  Head: Normocephalic.  Eyes: Pupils are equal, round, and reactive to light.  Cardiovascular: Normal rate, regular rhythm, normal heart sounds and intact distal pulses.   No murmur heard. Pulmonary/Chest: Effort normal and breath sounds normal. No respiratory distress. She has no wheezes.  Genitourinary:    There is tenderness (abscess) on the left labia.  Small abscess present on outer left labia  Musculoskeletal: Normal range of motion. She exhibits no edema.  Neurological: She is alert and oriented to person, place, and time.  Skin: Skin is warm and dry.  Psychiatric: She has a normal mood and affect. Her behavior is normal. Judgment and thought content normal.  Vitals reviewed.    BP 146/85 mmHg  Pulse 86  Temp(Src) 98.4 F (36.9 C) (Oral)  Wt 190 lb 3.2 oz (86.274 kg)      Assessment & Plan:  1. Abscess -Do not pick or squeeze  -Warm compresses RTO prn  - amoxicillin-clavulanate (AUGMENTIN) 875-125 MG tablet; Take 1 tablet by mouth 2 (two) times  daily.  Dispense: 20 tablet; Refill: 0  Evelina Dun, FNP

## 2016-06-02 NOTE — Patient Instructions (Signed)

## 2016-06-19 DIAGNOSIS — E782 Mixed hyperlipidemia: Secondary | ICD-10-CM | POA: Diagnosis not present

## 2016-06-19 DIAGNOSIS — Z008 Encounter for other general examination: Secondary | ICD-10-CM | POA: Diagnosis not present

## 2016-06-19 DIAGNOSIS — E559 Vitamin D deficiency, unspecified: Secondary | ICD-10-CM | POA: Diagnosis not present

## 2016-06-19 DIAGNOSIS — E669 Obesity, unspecified: Secondary | ICD-10-CM | POA: Diagnosis not present

## 2016-07-12 DIAGNOSIS — E669 Obesity, unspecified: Secondary | ICD-10-CM | POA: Diagnosis not present

## 2016-07-12 DIAGNOSIS — Z6832 Body mass index (BMI) 32.0-32.9, adult: Secondary | ICD-10-CM | POA: Diagnosis not present

## 2016-07-19 DIAGNOSIS — Z01419 Encounter for gynecological examination (general) (routine) without abnormal findings: Secondary | ICD-10-CM | POA: Diagnosis not present

## 2016-07-19 DIAGNOSIS — Z6831 Body mass index (BMI) 31.0-31.9, adult: Secondary | ICD-10-CM | POA: Diagnosis not present

## 2016-07-19 DIAGNOSIS — N951 Menopausal and female climacteric states: Secondary | ICD-10-CM | POA: Diagnosis not present

## 2016-07-22 DIAGNOSIS — R109 Unspecified abdominal pain: Secondary | ICD-10-CM | POA: Diagnosis not present

## 2016-07-22 DIAGNOSIS — K529 Noninfective gastroenteritis and colitis, unspecified: Secondary | ICD-10-CM | POA: Diagnosis not present

## 2016-07-22 DIAGNOSIS — K219 Gastro-esophageal reflux disease without esophagitis: Secondary | ICD-10-CM | POA: Diagnosis not present

## 2016-07-22 DIAGNOSIS — Z79899 Other long term (current) drug therapy: Secondary | ICD-10-CM | POA: Diagnosis not present

## 2016-07-25 ENCOUNTER — Ambulatory Visit (INDEPENDENT_AMBULATORY_CARE_PROVIDER_SITE_OTHER): Payer: BLUE CROSS/BLUE SHIELD | Admitting: Family

## 2016-07-25 ENCOUNTER — Encounter: Payer: Self-pay | Admitting: Family

## 2016-07-25 VITALS — BP 119/86 | HR 89 | Temp 97.5°F | Ht 63.0 in | Wt 187.0 lb

## 2016-07-25 DIAGNOSIS — K219 Gastro-esophageal reflux disease without esophagitis: Secondary | ICD-10-CM | POA: Diagnosis not present

## 2016-07-25 MED ORDER — PANTOPRAZOLE SODIUM 40 MG PO TBEC: 40.0000 mg | DELAYED_RELEASE_TABLET | Freq: Every day | ORAL | 3 refills | Status: DC

## 2016-07-25 NOTE — Patient Instructions (Signed)
Gastroesophageal Reflux Disease, Adult Normally, food travels down the esophagus and stays in the stomach to be digested. However, when a person has gastroesophageal reflux disease (GERD), food and stomach acid move back up into the esophagus. When this happens, the esophagus becomes sore and inflamed. Over time, GERD can create small holes (ulcers) in the lining of the esophagus.  CAUSES This condition is caused by a problem with the muscle between the esophagus and the stomach (lower esophageal sphincter, or LES). Normally, the LES muscle closes after food passes through the esophagus to the stomach. When the LES is weakened or abnormal, it does not close properly, and that allows food and stomach acid to go back up into the esophagus. The LES can be weakened by certain dietary substances, medicines, and medical conditions, including:  Tobacco use.  Pregnancy.  Having a hiatal hernia.  Heavy alcohol use.  Certain foods and beverages, such as coffee, chocolate, onions, and peppermint. RISK FACTORS This condition is more likely to develop in:  People who have an increased body weight.  People who have connective tissue disorders.  People who use NSAID medicines. SYMPTOMS Symptoms of this condition include:  Heartburn.  Difficult or painful swallowing.  The feeling of having a lump in the throat.  Abitter taste in the mouth.  Bad breath.  Having a large amount of saliva.  Having an upset or bloated stomach.  Belching.  Chest pain.  Shortness of breath or wheezing.  Ongoing (chronic) cough or a night-time cough.  Wearing away of tooth enamel.  Weight loss. Different conditions can cause chest pain. Make sure to see your health care provider if you experience chest pain. DIAGNOSIS Your health care provider will take a medical history and perform a physical exam. To determine if you have mild or severe GERD, your health care provider may also monitor how you respond  to treatment. You may also have other tests, including:  An endoscopy toexamine your stomach and esophagus with a small camera.  A test thatmeasures the acidity level in your esophagus.  A test thatmeasures how much pressure is on your esophagus.  A barium swallow or modified barium swallow to show the shape, size, and functioning of your esophagus. TREATMENT The goal of treatment is to help relieve your symptoms and to prevent complications. Treatment for this condition may vary depending on how severe your symptoms are. Your health care provider may recommend:  Changes to your diet.  Medicine.  Surgery. HOME CARE INSTRUCTIONS Diet  Follow a diet as recommended by your health care provider. This may involve avoiding foods and drinks such as:  Coffee and tea (with or without caffeine).  Drinks that containalcohol.  Energy drinks and sports drinks.  Carbonated drinks or sodas.  Chocolate and cocoa.  Peppermint and mint flavorings.  Garlic and onions.  Horseradish.  Spicy and acidic foods, including peppers, chili powder, curry powder, vinegar, hot sauces, and barbecue sauce.  Citrus fruit juices and citrus fruits, such as oranges, lemons, and limes.  Tomato-based foods, such as red sauce, chili, salsa, and pizza with red sauce.  Fried and fatty foods, such as donuts, french fries, potato chips, and high-fat dressings.  High-fat meats, such as hot dogs and fatty cuts of red and [place] meats, such as rib eye steak, sausage, ham, and bacon.  High-fat dairy items, such as whole milk, butter, and cream cheese.  Eat small, frequent meals instead of large meals.  Avoid drinking large amounts of liquid with your   meals.  Avoid eating meals during the 2-3 hours before bedtime.  Avoid lying down right after you eat.  Do not exercise right after you eat. General Instructions  Pay attention to any changes in your symptoms.  Take over-the-counter and prescription  medicines only as told by your health care provider. Do not take aspirin, ibuprofen, or other NSAIDs unless your health care provider told you to do so.  Do not use any tobacco products, including cigarettes, chewing tobacco, and e-cigarettes. If you need help quitting, ask your health care provider.  Wear loose-fitting clothing. Do not wear anything tight around your waist that causes pressure on your abdomen.  Raise (elevate) the head of your bed 6 inches (15cm).  Try to reduce your stress, such as with yoga or meditation. If you need help reducing stress, ask your health care provider.  If you are overweight, reduce your weight to an amount that is healthy for you. Ask your health care provider for guidance about a safe weight loss goal.  Keep all follow-up visits as told by your health care provider. This is important. SEEK MEDICAL CARE IF:  You have new symptoms.  You have unexplained weight loss.  You have difficulty swallowing, or it hurts to swallow.  You have wheezing or a persistent cough.  Your symptoms do not improve with treatment.  You have a hoarse voice. SEEK IMMEDIATE MEDICAL CARE IF:  You have pain in your arms, neck, jaw, teeth, or back.  You feel sweaty, dizzy, or light-headed.  You have chest pain or shortness of breath.  You vomit and your vomit looks like blood or coffee grounds.  You faint.  Your stool is bloody or black.  You cannot swallow, drink, or eat.   This information is not intended to replace advice given to you by your health care provider. Make sure you discuss any questions you have with your health care provider.   Document Released: 09/06/2005 Document Revised: 08/18/2015 Document Reviewed: 03/24/2015 Elsevier Interactive Patient Education 2016 [place] for Gastroesophageal Reflux Disease, Adult When you have gastroesophageal reflux disease (GERD), the foods you eat and your eating habits are very important.  Choosing the right foods can help ease the discomfort of GERD. WHAT GENERAL GUIDELINES DO I NEED TO FOLLOW?  Choose fruits, vegetables, whole grains, low-fat dairy products, and low-fat meat, fish, and poultry.  Limit fats such as oils, salad dressings, butter, nuts, and avocado.  Keep a food diary to identify foods that cause symptoms.  Avoid foods that cause reflux. These may be different for different people.  Eat frequent small meals instead of three large meals each day.  Eat your meals slowly, in a relaxed setting.  Limit fried foods.  Cook foods using methods other than frying.  Avoid drinking alcohol.  Avoid drinking large amounts of liquids with your meals.  Avoid bending over or lying down until 2-3 hours after eating. WHAT FOODS ARE NOT RECOMMENDED? The following are some foods and drinks that may worsen your symptoms: Vegetables Tomatoes. Tomato juice. Tomato and spaghetti sauce. Chili peppers. Onion and garlic. Horseradish. Fruits Oranges, grapefruit, and lemon (fruit and juice). Meats High-fat meats, fish, and poultry. This includes hot dogs, ribs, ham, sausage, salami, and bacon. Dairy Whole milk and chocolate milk. Sour cream. Cream. Butter. Ice cream. Cream cheese.  Beverages Coffee and tea, with or without caffeine. Carbonated beverages or energy drinks. Condiments Hot sauce. Barbecue sauce.  Sweets/Desserts Chocolate and cocoa. Donuts. Peppermint and spearmint.  Fats and Oils High-fat foods, including [place] fries and potato chips. Other Vinegar. Strong spices, such as black pepper, [place] pepper, red pepper, cayenne, curry powder, cloves, ginger, and chili powder. The items listed above may not be a complete list of foods and beverages to avoid. Contact your dietitian for more information.   This information is not intended to replace advice given to you by your health care provider. Make sure you discuss any questions you have with your health care  provider.   Document Released: 11/27/2005 Document Revised: 12/18/2014 Document Reviewed: 10/01/2013 Elsevier Interactive Patient Education [employer].

## 2016-07-25 NOTE — Progress Notes (Signed)
   Subjective:    Patient ID: Grace Gomez, female    DOB: 07-27-1966, 50 y.o.   MRN: [id number]  Gastroesophageal Reflux  She complains of belching, coughing, dysphagia, heartburn and nausea. This is a chronic problem. The current episode started more than 1 year ago. The problem occurs frequently. The problem has been waxing and waning. The heartburn is located in the substernum. The heartburn is of mild intensity. The heartburn wakes her from sleep. The heartburn limits her activity. The symptoms are aggravated by certain foods and lying down. Risk factors include obesity and NSAIDs. She has tried a PPI for the symptoms. The treatment provided no relief.      Review of Systems  Respiratory: Positive for cough.   Gastrointestinal: Positive for dysphagia, heartburn and nausea.  All other systems reviewed and are negative.      Objective:   Physical Exam  Constitutional: She is oriented to person, place, and time. She appears well-developed and well-nourished. No distress.  HENT:  Head: Normocephalic and atraumatic.  Eyes: Pupils are equal, round, and reactive to light.  Neck: Normal range of motion. Neck supple. No thyromegaly present.  Cardiovascular: Normal rate, regular rhythm, normal heart sounds and intact distal pulses.   No murmur heard. Pulmonary/Chest: Effort normal and breath sounds normal. No respiratory distress. She has no wheezes.  Abdominal: Soft. Bowel sounds are normal. She exhibits no distension. There is no tenderness.  Musculoskeletal: Normal range of motion. She exhibits no edema or tenderness.  Neurological: She is alert and oriented to person, place, and time. She has normal reflexes. No cranial nerve deficit.  Skin: Skin is warm and dry.  Psychiatric: She has a normal mood and affect. Her behavior is normal. Judgment and thought content normal.  Vitals reviewed.     BP 119/86   Pulse 89   Temp 97.5 F (36.4 C) (Oral)   Ht 5\' 3"  (1.6 m)   Wt 187 lb  (84.8 kg)   BMI 33.13 kg/m      Assessment & Plan:  1. Gastroesophageal reflux disease, esophagitis presence not specified -PT started on Protonix 40 mg today -Diet discussed- Avoid fried, spicy, citrus foods, caffeine and alcohol -Do not eat 2-3 hours before bedtime -Encouraged small frequent meals -Avoid NSAID's -RTO 2 weeks - pantoprazole (PROTONIX) 40 MG tablet; Take 1 tablet (40 mg total) by mouth daily.  Dispense: 30 tablet; Refill: [place], FNP

## 2016-07-26 DIAGNOSIS — Z6832 Body mass index (BMI) 32.0-32.9, adult: Secondary | ICD-10-CM | POA: Diagnosis not present

## 2016-07-26 DIAGNOSIS — K219 Gastro-esophageal reflux disease without esophagitis: Secondary | ICD-10-CM | POA: Diagnosis not present

## 2016-07-26 DIAGNOSIS — E669 Obesity, unspecified: Secondary | ICD-10-CM | POA: Diagnosis not present

## 2016-08-09 DIAGNOSIS — E669 Obesity, unspecified: Secondary | ICD-10-CM | POA: Diagnosis not present

## 2016-08-09 DIAGNOSIS — Z6832 Body mass index (BMI) 32.0-32.9, adult: Secondary | ICD-10-CM | POA: Diagnosis not present

## 2016-08-09 DIAGNOSIS — E559 Vitamin D deficiency, unspecified: Secondary | ICD-10-CM | POA: Diagnosis not present

## 2016-08-23 DIAGNOSIS — Z139 Encounter for screening, unspecified: Secondary | ICD-10-CM | POA: Diagnosis not present

## 2016-08-23 DIAGNOSIS — R7301 Impaired fasting glucose: Secondary | ICD-10-CM | POA: Diagnosis not present

## 2016-08-23 DIAGNOSIS — E782 Mixed hyperlipidemia: Secondary | ICD-10-CM | POA: Diagnosis not present

## 2016-08-23 DIAGNOSIS — E559 Vitamin D deficiency, unspecified: Secondary | ICD-10-CM | POA: Diagnosis not present

## 2016-08-28 DIAGNOSIS — E559 Vitamin D deficiency, unspecified: Secondary | ICD-10-CM | POA: Diagnosis not present

## 2016-08-28 DIAGNOSIS — R7301 Impaired fasting glucose: Secondary | ICD-10-CM | POA: Diagnosis not present

## 2016-08-28 DIAGNOSIS — E782 Mixed hyperlipidemia: Secondary | ICD-10-CM | POA: Diagnosis not present

## 2016-08-30 ENCOUNTER — Ambulatory Visit: Payer: BLUE CROSS/BLUE SHIELD | Admitting: Physician Assistant

## 2016-08-30 ENCOUNTER — Ambulatory Visit (INDEPENDENT_AMBULATORY_CARE_PROVIDER_SITE_OTHER): Payer: BLUE CROSS/BLUE SHIELD | Admitting: Physician Assistant

## 2016-08-30 ENCOUNTER — Encounter: Payer: Self-pay | Admitting: Physician Assistant

## 2016-08-30 VITALS — BP 132/80 | HR 78 | Temp 96.5°F | Ht 63.0 in | Wt 190.0 lb

## 2016-08-30 DIAGNOSIS — N898 Other specified noninflammatory disorders of vagina: Secondary | ICD-10-CM

## 2016-08-30 DIAGNOSIS — Z23 Encounter for immunization: Secondary | ICD-10-CM

## 2016-08-30 DIAGNOSIS — L298 Other pruritus: Secondary | ICD-10-CM

## 2016-08-30 LAB — WET PREP FOR TRICH, YEAST, CLUE
Clue Cell Exam: POSITIVE — AB
TRICHOMONAS EXAM: NEGATIVE
Yeast Exam: NEGATIVE

## 2016-08-30 MED ORDER — METRONIDAZOLE 500 MG PO TABS: 500.0000 mg | ORAL_TABLET | Freq: Three times a day (TID) | ORAL | 0 refills | Status: DC

## 2016-08-30 NOTE — Patient Instructions (Signed)

## 2016-08-30 NOTE — Progress Notes (Addendum)
BP 132/80   Pulse 78   Temp (!) 96.5 F (35.8 C) (Oral)   Ht 5\' 3"  (1.6 m)   Wt 190 lb (86.2 kg)   BMI 33.66 kg/m    Subjective:    Patient ID: Grace Gomez, female    DOB: 1966-10-12, 50 y.o.   MRN: [id number]  HPI: Grace Gomez is a 50 y.o. female presenting on 08/30/2016 for Vaginitis  She has a new symptom of significant vaginal itching with mild discharge. She states she does not have an odor. She has not had a change in sexual partners. In all of her previous wet mount labs she has not had Trichomonas or bacterial vaginosis. The swab will be performed today. She denies any fever or chills. She that she has used some new soap that seems to be irritating her in that area. We have discussed trying to use as gentle soap as possible in the perineal area.   Relevant past medical, surgical, family and social history reviewed and updated as indicated. Interim medical history since our last visit reviewed. Allergies and medications reviewed and updated. DATA REVIEWED: CHART IN EPIC  Social History   Social History  . Marital status: Single    Spouse name: N/A  . Number of children: N/A  . Years of education: N/A   Occupational History  . Not on file.   Social History Main Topics  . Smoking status: Former Smoker    Packs/day: 1.00    Types: Cigarettes    Start date: 07/06/1980    Quit date: 08/20/2001  . Smokeless tobacco: Never Used  . Alcohol use No  . Drug use: No  . Sexual activity: Not on file   Other Topics Concern  . Not on file   Social History Narrative  . No narrative on file    Past Surgical History:  Procedure Laterality Date  . TUBAL LIGATION      Family History  Problem Relation Age of Onset  . Heart disease Mother   . Hypertension Mother   . Cancer Father   . Heart disease Brother   . Heart disease Maternal Aunt     Review of Systems  Constitutional: Negative.   HENT: Negative.   Eyes: Negative.   Respiratory: Negative.     Gastrointestinal: Negative.   Genitourinary: Positive for vaginal discharge. Negative for difficulty urinating, dysuria, flank pain, frequency and vaginal pain.      Medication List       Accurate as of 08/30/16 11:26 AM. Always use your most recent med list.          metroNIDAZOLE 500 MG tablet Commonly known as:  FLAGYL Take 1 tablet (500 mg total) by mouth 3 (three) times daily.   pantoprazole 40 MG tablet Commonly known as:  PROTONIX Take 1 tablet (40 mg total) by mouth daily.          Objective:    BP 132/80   Pulse 78   Temp (!) 96.5 F (35.8 C) (Oral)   Ht 5\' 3"  (1.6 m)   Wt 190 lb (86.2 kg)   BMI 33.66 kg/m   Allergies  Allergen Reactions  . Sulfa Antibiotics Hives    Wt Readings from Last 3 Encounters:  08/30/16 190 lb (86.2 kg)  07/25/16 187 lb (84.8 kg)  06/02/16 190 lb 3.2 oz (86.3 kg)    Physical Exam  Constitutional: She is oriented to person, place, and time. She appears well-developed and well-nourished.  HENT:  Head: Normocephalic and atraumatic.  Eyes: Conjunctivae and EOM are normal. Pupils are equal, round, and reactive to light.  Cardiovascular: Normal rate, regular rhythm, normal heart sounds and intact distal pulses.   Pulmonary/Chest: Effort normal and breath sounds normal.  Abdominal: Soft. Bowel sounds are normal.  Neurological: She is alert and oriented to person, place, and time.  Skin: Skin is warm and dry. No rash noted.  Psychiatric: She has a normal mood and affect. Her behavior is normal. Judgment and thought content normal.  Vitals reviewed.   Results for orders placed or performed in visit on 01/25/16  POCT Wet Prep [facility])  Result Value Ref Range   Source Wet Prep POC vaginal    WBC, Wet Prep HPF POC 5-8    Bacteria Wet Prep HPF POC Few None, Few, Too numerous to count   Clue Cells Wet Prep HPF POC None None, Too numerous to count   Yeast Wet Prep HPF POC None    KOH Wet Prep POC     Trichomonas Wet Prep HPF  POC none       Assessment & Plan:   1. Vaginal itching - WET PREP FOR TRICH, YEAST, CLUE  2. Bacterial vaginosis Wet mount POSITIVE for clue cells and numerous bacteria, neg for yeast or trichomonas Flagyl 500 mg 1 TID 7 days,  Avoid new soaps to perineal area  Continue all other maintenance medications as listed above.  Follow up plan: Return if symptoms worsen or fail to improve.   Orders Placed This Encounter  Procedures  . WET PREP FOR [place], YEAST, CLUE  . Flu Vaccine QUAD 36+ mos IM    Educational handout given for Bacterial vaginosis  Terald Sleeper PA-C [place] [address]  [place], [place] [address] [phone]   08/30/2016, 11:26 AM

## 2016-09-01 DIAGNOSIS — L0390 Cellulitis, unspecified: Secondary | ICD-10-CM | POA: Diagnosis not present

## 2016-10-09 DIAGNOSIS — A0472 Enterocolitis due to Clostridium difficile, not specified as recurrent: Secondary | ICD-10-CM | POA: Diagnosis not present

## 2016-10-09 DIAGNOSIS — R112 Nausea with vomiting, unspecified: Secondary | ICD-10-CM | POA: Diagnosis not present

## 2016-10-09 DIAGNOSIS — R197 Diarrhea, unspecified: Secondary | ICD-10-CM | POA: Diagnosis not present

## 2016-10-09 DIAGNOSIS — K219 Gastro-esophageal reflux disease without esophagitis: Secondary | ICD-10-CM | POA: Diagnosis not present

## 2016-10-26 DIAGNOSIS — K589 Irritable bowel syndrome without diarrhea: Secondary | ICD-10-CM | POA: Diagnosis not present

## 2016-10-26 DIAGNOSIS — N3001 Acute cystitis with hematuria: Secondary | ICD-10-CM | POA: Diagnosis not present

## 2016-10-26 DIAGNOSIS — N3000 Acute cystitis without hematuria: Secondary | ICD-10-CM | POA: Diagnosis not present

## 2016-10-26 DIAGNOSIS — Z79899 Other long term (current) drug therapy: Secondary | ICD-10-CM | POA: Diagnosis not present

## 2016-10-26 DIAGNOSIS — K219 Gastro-esophageal reflux disease without esophagitis: Secondary | ICD-10-CM | POA: Diagnosis not present

## 2016-10-26 DIAGNOSIS — R112 Nausea with vomiting, unspecified: Secondary | ICD-10-CM | POA: Diagnosis not present

## 2016-10-26 DIAGNOSIS — Z87891 Personal history of nicotine dependence: Secondary | ICD-10-CM | POA: Diagnosis not present

## 2016-10-26 DIAGNOSIS — R197 Diarrhea, unspecified: Secondary | ICD-10-CM | POA: Diagnosis not present

## 2016-10-26 DIAGNOSIS — E7800 Pure hypercholesterolemia, unspecified: Secondary | ICD-10-CM | POA: Diagnosis not present

## 2016-10-27 ENCOUNTER — Telehealth: Payer: Self-pay | Admitting: Family

## 2016-10-27 MED ORDER — DEXLANSOPRAZOLE 60 MG PO CPDR: 60.0000 mg | DELAYED_RELEASE_CAPSULE | Freq: Every day | ORAL | 1 refills | Status: DC

## 2016-10-27 NOTE — Telephone Encounter (Signed)
Patient reports the Protonix does not seem to be helping her.  She is still experiencing severe belching and heartburn, nausea and abdominal pain.  She said she had taken Omeprazole in the past and it did not help either.  She said at her appointment in August you had told her if the Protonix did not help there was something stronger that could be sent in instead.  Patient would like to try this, uses [facility] [place].

## 2016-10-27 NOTE — Telephone Encounter (Signed)
Patient notified via detailed letter

## 2016-10-27 NOTE — Telephone Encounter (Signed)
PT to stop protonix and start [place]. Prescription sent to pharmacy

## 2016-10-27 NOTE — Telephone Encounter (Signed)
Addendum:  Patient notified via detailed message on voicemail.

## 2016-11-10 DIAGNOSIS — M940 Chondrocostal junction syndrome [Tietze]: Secondary | ICD-10-CM | POA: Diagnosis not present

## 2016-11-15 DIAGNOSIS — Z6832 Body mass index (BMI) 32.0-32.9, adult: Secondary | ICD-10-CM | POA: Diagnosis not present

## 2016-11-15 DIAGNOSIS — E669 Obesity, unspecified: Secondary | ICD-10-CM | POA: Diagnosis not present

## 2016-12-13 DIAGNOSIS — Z008 Encounter for other general examination: Secondary | ICD-10-CM | POA: Diagnosis not present

## 2016-12-13 DIAGNOSIS — E782 Mixed hyperlipidemia: Secondary | ICD-10-CM | POA: Diagnosis not present

## 2016-12-13 DIAGNOSIS — E559 Vitamin D deficiency, unspecified: Secondary | ICD-10-CM | POA: Diagnosis not present

## 2016-12-13 DIAGNOSIS — L259 Unspecified contact dermatitis, unspecified cause: Secondary | ICD-10-CM | POA: Diagnosis not present

## 2016-12-22 DIAGNOSIS — L259 Unspecified contact dermatitis, unspecified cause: Secondary | ICD-10-CM | POA: Diagnosis not present

## 2016-12-22 DIAGNOSIS — I10 Essential (primary) hypertension: Secondary | ICD-10-CM | POA: Diagnosis not present

## 2016-12-27 DIAGNOSIS — I10 Essential (primary) hypertension: Secondary | ICD-10-CM | POA: Diagnosis not present

## 2016-12-27 DIAGNOSIS — L259 Unspecified contact dermatitis, unspecified cause: Secondary | ICD-10-CM | POA: Diagnosis not present

## 2016-12-27 DIAGNOSIS — H6983 Other specified disorders of Eustachian tube, bilateral: Secondary | ICD-10-CM | POA: Diagnosis not present

## 2016-12-27 DIAGNOSIS — R002 Palpitations: Secondary | ICD-10-CM | POA: Diagnosis not present

## 2017-01-01 ENCOUNTER — Ambulatory Visit (INDEPENDENT_AMBULATORY_CARE_PROVIDER_SITE_OTHER): Payer: BLUE CROSS/BLUE SHIELD | Admitting: Family

## 2017-01-01 ENCOUNTER — Encounter: Payer: Self-pay | Admitting: Family

## 2017-01-01 VITALS — BP 140/93 | HR 88 | Temp 99.0°F | Ht 63.0 in | Wt 190.4 lb

## 2017-01-01 DIAGNOSIS — R6889 Other general symptoms and signs: Secondary | ICD-10-CM | POA: Diagnosis not present

## 2017-01-01 LAB — VERITOR FLU A/B WAIVED
Influenza A: NEGATIVE
Influenza B: NEGATIVE

## 2017-01-01 MED ORDER — OSELTAMIVIR PHOSPHATE 75 MG PO CAPS: 75.0000 mg | ORAL_CAPSULE | Freq: Two times a day (BID) | ORAL | 0 refills | Status: DC

## 2017-01-01 NOTE — Progress Notes (Addendum)
   Subjective:    Patient ID: Grace Gomez, female    DOB: 1966-09-16, 51 y.o.   MRN: [id number]  Headache   This is a new problem. Associated symptoms include coughing, a fever and a sore throat.  Cough  This is a new problem. The current episode started yesterday. The problem has been gradually worsening. The problem occurs every few minutes. The cough is non-productive. Associated symptoms include chills, a fever, headaches, myalgias, nasal congestion, postnasal drip, a sore throat and wheezing. The symptoms are aggravated by lying down. She has tried rest and OTC cough suppressant for the symptoms. The treatment provided mild relief.  Fever   Associated symptoms include coughing, headaches, a sore throat and wheezing.      Review of Systems  Constitutional: Positive for chills and fever.  HENT: Positive for postnasal drip and sore throat.   Respiratory: Positive for cough and wheezing.   Musculoskeletal: Positive for myalgias.  Neurological: Positive for headaches.  All other systems reviewed and are negative.      Objective:   Physical Exam  Constitutional: She is oriented to person, place, and time. She appears well-developed and well-nourished. She has a sickly appearance. No distress.  HENT:  Head: Normocephalic and atraumatic.  Right Ear: External ear normal.  Left Ear: External ear normal.  Nose: Mucosal edema and rhinorrhea present.  Mouth/Throat: Posterior oropharyngeal erythema present.  Eyes: Pupils are equal, round, and reactive to light.  Neck: Normal range of motion. Neck supple. No thyromegaly present.  Cardiovascular: Normal rate, regular rhythm, normal heart sounds and intact distal pulses.   No murmur heard. Pulmonary/Chest: Effort normal and breath sounds normal. No respiratory distress. She has no wheezes.  Nonproductive cough  Abdominal: Soft. Bowel sounds are normal. She exhibits no distension. There is no tenderness.  Musculoskeletal: Normal range of  motion. She exhibits no edema or tenderness.  Neurological: She is alert and oriented to person, place, and time. She has normal reflexes. No cranial nerve deficit.  Skin: Skin is warm and dry.  Psychiatric: She has a normal mood and affect. Her behavior is normal. Judgment and thought content normal.  Vitals reviewed.     BP (!) 140/93   Pulse 88   Temp 99 F (37.2 C) (Oral)   Ht 5\' 3"  (1.6 m)   Wt 190 lb 6.4 oz (86.4 kg)   BMI 33.73 kg/m      Assessment & Plan:  1. Flu-like symptoms -Force fluids -Tylenol prn for aches and pain -Will treat with tamiflu even though flu negative, pt clinically looks like flu -Avoid crowds and good hand hygiene discussed -RTO prn  - Bayer DCA Hb A1c Waived - Veritor Flu A/B Waived - oseltamivir (TAMIFLU) 75 MG capsule; Take 1 capsule (75 mg total) by mouth 2 (two) times daily.  Dispense: 10 capsule; Refill: 0  Evelina Dun, FNP

## 2017-01-01 NOTE — Patient Instructions (Signed)

## 2017-01-01 NOTE — Addendum Note (Signed)
Addended by: Shelbie Ammons on: 01/01/2017 12:05 PM   Modules accepted: Orders

## 2017-01-15 DIAGNOSIS — E559 Vitamin D deficiency, unspecified: Secondary | ICD-10-CM | POA: Diagnosis not present

## 2017-01-15 DIAGNOSIS — Z139 Encounter for screening, unspecified: Secondary | ICD-10-CM | POA: Diagnosis not present

## 2017-01-15 DIAGNOSIS — R7301 Impaired fasting glucose: Secondary | ICD-10-CM | POA: Diagnosis not present

## 2017-01-15 DIAGNOSIS — E782 Mixed hyperlipidemia: Secondary | ICD-10-CM | POA: Diagnosis not present

## 2017-01-24 DIAGNOSIS — R7301 Impaired fasting glucose: Secondary | ICD-10-CM | POA: Diagnosis not present

## 2017-01-24 DIAGNOSIS — E559 Vitamin D deficiency, unspecified: Secondary | ICD-10-CM | POA: Diagnosis not present

## 2017-01-24 DIAGNOSIS — E782 Mixed hyperlipidemia: Secondary | ICD-10-CM | POA: Diagnosis not present

## 2017-01-24 DIAGNOSIS — E669 Obesity, unspecified: Secondary | ICD-10-CM | POA: Diagnosis not present

## 2017-03-01 ENCOUNTER — Ambulatory Visit (INDEPENDENT_AMBULATORY_CARE_PROVIDER_SITE_OTHER): Payer: BLUE CROSS/BLUE SHIELD | Admitting: Family

## 2017-03-01 ENCOUNTER — Encounter: Payer: Self-pay | Admitting: Family

## 2017-03-01 VITALS — BP 153/86 | HR 82 | Temp 97.0°F | Ht 63.0 in | Wt 192.0 lb

## 2017-03-01 DIAGNOSIS — L659 Nonscarring hair loss, unspecified: Secondary | ICD-10-CM

## 2017-03-01 DIAGNOSIS — R5383 Other fatigue: Secondary | ICD-10-CM | POA: Diagnosis not present

## 2017-03-01 DIAGNOSIS — N898 Other specified noninflammatory disorders of vagina: Secondary | ICD-10-CM | POA: Diagnosis not present

## 2017-03-01 LAB — WET PREP FOR TRICH, YEAST, CLUE
Clue Cell Exam: NEGATIVE
TRICHOMONAS EXAM: NEGATIVE
Yeast Exam: NEGATIVE

## 2017-03-01 NOTE — Patient Instructions (Signed)
Alopecia Areata Alopecia areata is a type of hair loss. If you have this condition, you may lose hair on your scalp in patches. In some cases, you may lose all the hair on your scalp (alopecia totalis) or all the hair from your face and body (alopecia universalis).  Alopecia areata is an autoimmune disease. This means your body's defense system (immune system) mistakes normal parts of your body for germs or other things that can make you sick. When you have alopecia areata, your immune system attacks your hair follicles.  Alopecia areata often starts during childhood but can occur at any age. Alopecia areata is not a danger to your health but can be stressful.  CAUSES  The cause of alopecia areata is unknown.  RISK FACTORS You may be at higher risk of alopecia areata if you:   Have a family history of alopecia.  Have a family history of another autoimmune disease, including type 1 diabetes and rheumatoid arthritis. SIGNS AND SYMPTOMS Signs of alopecia areata may include:  Loss of scalp hair in small, round patches. These may be about the size of a quarter.  Loss of all hair on your scalp.  Loss of eyebrow hair, facial hair, or the hair inside your nose (nasal hair).  Hair loss over your entire body. DIAGNOSIS  Alopecia areata may be diagnosed by:  Medical history and physical exam.  Taking a sample of hair to check under a microscope.  Taking a small piece of skin (biopsy) to examine under a microscope.  Blood tests to rule out other autoimmune diseases. TREATMENT  There is no cure for alopecia areata, but the disease often goes away over time. You will not lose the ability to regrow hair. Some medicines may help your hair regrow more quickly. These include:  Corticosteroids. These block inflammation caused by your immune system. You may get this medicine as a lotion for your skin or as an injection.  Minoxidil. This is a hair growth medicine you can use in areas of hair  loss.  Anthralin. This is a medicine for a skin inflammation called psoriasis that may also help alopecia.  Diphencyprone. This medicine is applied to your skin and may stimulate hair growth. HOME CARE INSTRUCTIONS  Use sunscreen or cover your head when outdoors.  Take medicines only as directed by your health care provider.  If you have lost your eyebrows, wear sunglasses outside to keep dust out of your eyes.  If you have lost hair inside your nose, wear a kerchief over your face or apply ointment to the inside of your nose. This keeps out dust and other irritants.  Keep all follow-up visits as directed by your health care provider. This is important. SEEK MEDICAL CARE IF:  Your symptoms change.  You have new symptoms.  You have a reaction to your medicines.  You are struggling emotionally. This information is not intended to replace advice given to you by your health care provider. Make sure you discuss any questions you have with your health care provider. Document Released: 07/01/2004 Document Revised: 12/18/2014 Document Reviewed: 02/16/2014 Elsevier Interactive Patient Education  2017 [employer].

## 2017-03-01 NOTE — Progress Notes (Signed)
   Subjective:    Patient ID: Grace Gomez, female    DOB: 1966-07-11, 51 y.o.   MRN: [id number]  Pt presents to the office today with vaginal discharge and states her hair has started falling out over the last 6 months. Pt reports of fatigue at times, but is active. Pt reports having a period every month.  Vaginal Discharge  The patient's primary symptoms include vaginal discharge. The patient's pertinent negatives include no genital itching, genital lesions, genital odor, genital rash or missed menses. This is a new problem. The current episode started 1 to 4 weeks ago. The problem occurs intermittently. The problem has been unchanged. The patient is experiencing no pain. Pertinent negatives include no chills, constipation, diarrhea, flank pain, frequency or painful intercourse. The vaginal discharge was clear.      Review of Systems  Constitutional: Negative for chills.  Gastrointestinal: Negative for constipation and diarrhea.  Genitourinary: Positive for vaginal discharge. Negative for flank pain, frequency and missed menses.  All other systems reviewed and are negative.      Objective:   Physical Exam  Constitutional: She is oriented to person, place, and time. She appears well-developed and well-nourished. No distress.  HENT:  Head: Normocephalic.  Eyes: Pupils are equal, round, and reactive to light.  Neck: Normal range of motion. Neck supple. No thyromegaly present.  Cardiovascular: Normal rate, regular rhythm, normal heart sounds and intact distal pulses.   No murmur heard. Pulmonary/Chest: Effort normal and breath sounds normal. No respiratory distress. She has no wheezes.  Abdominal: Soft. Bowel sounds are normal. She exhibits no distension. There is no tenderness.  Musculoskeletal: Normal range of motion. She exhibits no edema or tenderness.  Neurological: She is alert and oriented to person, place, and time.  Skin: Skin is warm and dry.  Psychiatric: She has a normal  mood and affect. Her behavior is normal. Judgment and thought content normal.  Vitals reviewed.     BP (!) 153/86   Pulse 82   Temp 97 F (36.1 C) (Oral)   Ht '5\' 3"'$  (1.6 m)   Wt 192 lb (87.1 kg)   BMI 34.01 kg/m      Assessment & Plan:  1. Vaginal discharge - WET PREP FOR TRICH, YEAST, CLUE - CMP14+EGFR  2. Hair loss - Anemia Profile B - CMP14+EGFR - Thyroid Panel With TSH - FSH/LH  3. Other fatigue - Anemia Profile B - CMP14+EGFR - Thyroid Panel With TSH - VITAMIN D 25 Hydroxy (Vit-D Deficiency, Fractures) - FSH/LH   Continue all meds Labs pending Health Maintenance reviewed Diet and exercise encouraged RTO as needed and keep follow up appt  Evelina Dun, FNP

## 2017-03-02 ENCOUNTER — Other Ambulatory Visit: Payer: Self-pay | Admitting: Family

## 2017-03-02 LAB — ANEMIA PROFILE B
Basophils Absolute: 0.1 10*3/uL (ref 0.0–0.2)
Basos: 1 %
EOS (ABSOLUTE): 0.1 10*3/uL (ref 0.0–0.4)
Eos: 2 %
FERRITIN: 61 ng/mL (ref 15–150)
Folate: 5.5 ng/mL (ref >3.0)
Hematocrit: 43.4 % (ref 34.0–46.6)
Hemoglobin: 14.4 g/dL (ref 11.1–15.9)
IMMATURE GRANS (ABS): 0 10*3/uL (ref 0.0–0.1)
IRON SATURATION: 29 % (ref 15–55)
Immature Granulocytes: 0 %
Iron: 87 ug/dL (ref 27–159)
LYMPHS ABS: 2.3 10*3/uL (ref 0.7–3.1)
Lymphs: 32 %
MCH: 29.9 pg (ref 26.6–33.0)
MCHC: 33.2 g/dL (ref 31.5–35.7)
MCV: 90 fL (ref 79–97)
MONOCYTES: 6 %
Monocytes Absolute: 0.4 10*3/uL (ref 0.1–0.9)
NEUTROS PCT: 59 %
Neutrophils Absolute: 4.3 10*3/uL (ref 1.4–7.0)
PLATELETS: 311 10*3/uL (ref 150–379)
RBC: 4.82 x10E6/uL (ref 3.77–5.28)
RDW: 13.8 % (ref 12.3–15.4)
Retic Ct Pct: 0.9 % (ref 0.6–2.6)
TIBC: 304 ug/dL (ref 250–450)
UIBC: 217 ug/dL (ref 131–425)
Vitamin B-12: 458 pg/mL (ref 232–1245)
WBC: 7.2 10*3/uL (ref 3.4–10.8)

## 2017-03-02 LAB — CMP14+EGFR
A/G RATIO: 1.5 (ref 1.2–2.2)
ALT: 18 IU/L (ref 0–32)
AST: 13 IU/L (ref 0–40)
Albumin: 4 g/dL (ref 3.5–5.5)
Alkaline Phosphatase: 89 IU/L (ref 39–117)
BUN/Creatinine Ratio: 27 — ABNORMAL HIGH (ref 9–23)
BUN: 16 mg/dL (ref 6–24)
Bilirubin Total: 0.3 mg/dL (ref 0.0–1.2)
CO2: 26 mmol/L (ref 18–29)
Calcium: 9.5 mg/dL (ref 8.7–10.2)
Chloride: 100 mmol/L (ref 96–106)
Creatinine, Ser: 0.6 mg/dL (ref 0.57–1.00)
GFR, EST AFRICAN AMERICAN: 121 mL/min/{1.73_m2} (ref >59)
GFR, EST NON AFRICAN AMERICAN: 105 mL/min/{1.73_m2} (ref >59)
GLOBULIN, TOTAL: 2.6 g/dL (ref 1.5–4.5)
Glucose: 78 mg/dL (ref 65–99)
POTASSIUM: 4.7 mmol/L (ref 3.5–5.2)
Sodium: 140 mmol/L (ref 134–144)
TOTAL PROTEIN: 6.6 g/dL (ref 6.0–8.5)

## 2017-03-02 LAB — FSH/LH
FSH: 31.4 m[IU]/mL
LH: 22.5 m[IU]/mL

## 2017-03-02 LAB — VITAMIN D 25 HYDROXY (VIT D DEFICIENCY, FRACTURES): Vit D, 25-Hydroxy: 22.3 ng/mL — ABNORMAL LOW (ref 30.0–100.0)

## 2017-03-02 LAB — THYROID PANEL WITH TSH
Free Thyroxine Index: 1.5 (ref 1.2–4.9)
T3 Uptake Ratio: 22 % — ABNORMAL LOW (ref 24–39)
T4, Total: 7 ug/dL (ref 4.5–12.0)
TSH: 1.57 u[IU]/mL (ref 0.450–4.500)

## 2017-03-02 MED ORDER — VITAMIN D (ERGOCALCIFEROL) 1.25 MG (50000 UNIT) PO CAPS: 50000.0000 [IU] | ORAL_CAPSULE | ORAL | 3 refills | Status: DC

## 2017-03-07 DIAGNOSIS — L659 Nonscarring hair loss, unspecified: Secondary | ICD-10-CM | POA: Diagnosis not present

## 2017-03-07 DIAGNOSIS — Z713 Dietary counseling and surveillance: Secondary | ICD-10-CM | POA: Diagnosis not present

## 2017-03-07 DIAGNOSIS — E669 Obesity, unspecified: Secondary | ICD-10-CM | POA: Diagnosis not present

## 2017-03-07 DIAGNOSIS — Z6833 Body mass index (BMI) 33.0-33.9, adult: Secondary | ICD-10-CM | POA: Diagnosis not present

## 2017-03-21 DIAGNOSIS — D229 Melanocytic nevi, unspecified: Secondary | ICD-10-CM | POA: Diagnosis not present

## 2017-03-21 DIAGNOSIS — L659 Nonscarring hair loss, unspecified: Secondary | ICD-10-CM | POA: Diagnosis not present

## 2017-04-04 DIAGNOSIS — R7301 Impaired fasting glucose: Secondary | ICD-10-CM | POA: Diagnosis not present

## 2017-04-04 DIAGNOSIS — E669 Obesity, unspecified: Secondary | ICD-10-CM | POA: Diagnosis not present

## 2017-04-04 DIAGNOSIS — L659 Nonscarring hair loss, unspecified: Secondary | ICD-10-CM | POA: Diagnosis not present

## 2017-04-04 DIAGNOSIS — Z719 Counseling, unspecified: Secondary | ICD-10-CM | POA: Diagnosis not present

## 2017-04-04 DIAGNOSIS — Z6833 Body mass index (BMI) 33.0-33.9, adult: Secondary | ICD-10-CM | POA: Diagnosis not present

## 2017-04-04 DIAGNOSIS — Z008 Encounter for other general examination: Secondary | ICD-10-CM | POA: Diagnosis not present

## 2017-04-04 DIAGNOSIS — Z713 Dietary counseling and surveillance: Secondary | ICD-10-CM | POA: Diagnosis not present

## 2017-04-04 DIAGNOSIS — E782 Mixed hyperlipidemia: Secondary | ICD-10-CM | POA: Diagnosis not present

## 2017-04-09 DIAGNOSIS — R7301 Impaired fasting glucose: Secondary | ICD-10-CM | POA: Diagnosis not present

## 2017-04-09 DIAGNOSIS — E782 Mixed hyperlipidemia: Secondary | ICD-10-CM | POA: Diagnosis not present

## 2017-05-16 DIAGNOSIS — Z6833 Body mass index (BMI) 33.0-33.9, adult: Secondary | ICD-10-CM | POA: Diagnosis not present

## 2017-05-16 DIAGNOSIS — E669 Obesity, unspecified: Secondary | ICD-10-CM | POA: Diagnosis not present

## 2017-07-02 DIAGNOSIS — K219 Gastro-esophageal reflux disease without esophagitis: Secondary | ICD-10-CM | POA: Diagnosis not present

## 2017-07-02 DIAGNOSIS — J45909 Unspecified asthma, uncomplicated: Secondary | ICD-10-CM | POA: Diagnosis not present

## 2017-07-02 DIAGNOSIS — Z6833 Body mass index (BMI) 33.0-33.9, adult: Secondary | ICD-10-CM | POA: Diagnosis not present

## 2017-07-02 DIAGNOSIS — R7301 Impaired fasting glucose: Secondary | ICD-10-CM | POA: Diagnosis not present

## 2017-07-02 DIAGNOSIS — Z719 Counseling, unspecified: Secondary | ICD-10-CM | POA: Diagnosis not present

## 2017-07-02 DIAGNOSIS — Z008 Encounter for other general examination: Secondary | ICD-10-CM | POA: Diagnosis not present

## 2017-07-02 DIAGNOSIS — L659 Nonscarring hair loss, unspecified: Secondary | ICD-10-CM | POA: Diagnosis not present

## 2017-07-02 DIAGNOSIS — E782 Mixed hyperlipidemia: Secondary | ICD-10-CM | POA: Diagnosis not present

## 2017-07-02 DIAGNOSIS — Z713 Dietary counseling and surveillance: Secondary | ICD-10-CM | POA: Diagnosis not present

## 2017-07-11 DIAGNOSIS — E782 Mixed hyperlipidemia: Secondary | ICD-10-CM | POA: Diagnosis not present

## 2017-07-11 DIAGNOSIS — R7301 Impaired fasting glucose: Secondary | ICD-10-CM | POA: Diagnosis not present

## 2017-07-11 DIAGNOSIS — K219 Gastro-esophageal reflux disease without esophagitis: Secondary | ICD-10-CM | POA: Diagnosis not present

## 2017-08-08 ENCOUNTER — Encounter: Payer: BLUE CROSS/BLUE SHIELD | Admitting: *Deleted

## 2017-08-21 ENCOUNTER — Encounter: Payer: Self-pay | Admitting: Family

## 2017-08-21 ENCOUNTER — Ambulatory Visit (INDEPENDENT_AMBULATORY_CARE_PROVIDER_SITE_OTHER): Payer: BLUE CROSS/BLUE SHIELD | Admitting: Family

## 2017-08-21 VITALS — BP 130/85 | HR 73 | Temp 97.1°F | Ht 63.0 in | Wt 193.0 lb

## 2017-08-21 DIAGNOSIS — R142 Eructation: Secondary | ICD-10-CM | POA: Diagnosis not present

## 2017-08-21 DIAGNOSIS — R1013 Epigastric pain: Secondary | ICD-10-CM | POA: Diagnosis not present

## 2017-08-21 DIAGNOSIS — K279 Peptic ulcer, site unspecified, unspecified as acute or chronic, without hemorrhage or perforation: Secondary | ICD-10-CM

## 2017-08-21 MED ORDER — DEXLANSOPRAZOLE 60 MG PO CPDR: 60.0000 mg | DELAYED_RELEASE_CAPSULE | Freq: Every day | ORAL | 1 refills | Status: DC

## 2017-08-21 NOTE — Progress Notes (Signed)
   Subjective:    Patient ID: Grace Gomez, female    DOB: 1966/01/06, 51 y.o.   MRN: [id number]  Abdominal Pain  This is a recurrent problem. The current episode started 1 to 4 weeks ago. The onset quality is gradual. The problem occurs intermittently. The problem has been gradually worsening. The pain is located in the RUQ. The pain is at a severity of 9/10. The pain is moderate. The quality of the pain is sharp. The abdominal pain radiates to the back. Associated symptoms include constipation, diarrhea and vomiting. Pertinent negatives include no belching, dysuria, flatus, frequency, headaches, hematuria or nausea. Nothing aggravates the pain. The pain is relieved by nothing. She has tried proton pump inhibitors for the symptoms. The treatment provided mild relief.      Review of Systems  Gastrointestinal: Positive for abdominal pain, constipation, diarrhea and vomiting. Negative for flatus and nausea.  Genitourinary: Negative for dysuria, frequency and hematuria.  Neurological: Negative for headaches.  All other systems reviewed and are negative.      Objective:   Physical Exam  Constitutional: She is oriented to person, place, and time. She appears well-developed and well-nourished. No distress.  HENT:  Head: Normocephalic and atraumatic.  Right Ear: External ear normal.  Left Ear: External ear normal.  Nose: Nose normal.  Mouth/Throat: Oropharynx is clear and moist.  Eyes: Pupils are equal, round, and reactive to light.  Neck: Normal range of motion. Neck supple. No thyromegaly present.  Cardiovascular: Normal rate, regular rhythm, normal heart sounds and intact distal pulses.   No murmur heard. Pulmonary/Chest: Effort normal and breath sounds normal. No respiratory distress. She has no wheezes.  Abdominal: Soft. Bowel sounds are normal. She exhibits no distension. There is tenderness (mild generalized abd tenderness).  Musculoskeletal: Normal range of motion. She exhibits no  edema or tenderness.  Neurological: She is alert and oriented to person, place, and time.  Skin: Skin is warm and dry.  Psychiatric: She has a normal mood and affect. Her behavior is normal. Judgment and thought content normal.  Vitals reviewed.   BP 130/85   Pulse 73   Temp (!) 97.1 F (36.2 C) (Oral)   Ht '5\' 3"'$  (1.6 m)   Wt 193 lb (87.5 kg)   BMI 34.19 kg/m        Assessment & Plan:  1. Epigastric pain - CBC with Differential/Platelet - CMP14+EGFR - Amylase - Lipase - H Pylori, IGM, IGG, IGA AB  2. Belching - CBC with Differential/Platelet - CMP14+EGFR  3. PUD (peptic ulcer disease) Pt to restart Dexilant 60 mg  -Diet discussed- Avoid fried, spicy, citrus foods, caffeine and alcohol -Do not eat 2-3 hours before bedtime -Encouraged small frequent meals -Avoid NSAID's - dexlansoprazole (DEXILANT) 60 MG capsule; Take 1 capsule (60 mg total) by mouth daily.  Dispense: 90 capsule; Refill: 1   Labs pending  Evelina Dun, FNP

## 2017-08-21 NOTE — Patient Instructions (Signed)
Peptic Ulcer  A peptic ulcer is a sore in the lining of the esophagus (esophageal ulcer), the stomach (gastric ulcer), or the first part of the small intestine (duodenal ulcer). The ulcer causes gradual wearing away (erosion) into the deeper tissue.  What are the causes?  Normally, the lining of the stomach and the small intestine protects itself from the acid that digests food. The protective lining can be damaged by:   An infection caused by a germ (bacterium) called Helicobacter pylori or H. pylori.   Regular use of NSAIDs, such as ibuprofen or aspirin.   Rare tumors in the stomach, small intestine, or pancreas (Zollinger-Ellison syndrome).    What increases the risk?  The following factors may make you more likely to develop this condition:   Smoking.   Having a family history of ulcer disease.    What are the signs or symptoms?  Symptoms of this condition include:   Burning pain or gnawing in the area between the chest and the belly button. The pain may be worse on an empty stomach and at night.   Heartburn.   Nausea and vomiting.   Bloating.    If the ulcer results in bleeding, it can cause:   Black, tarry stools.   Vomiting of bright red blood.   Vomiting of material that looks like coffee grounds.    How is this diagnosed?  This condition may be diagnosed based on:   Medical history and physical exam.   Various tests or procedures, such as:  ? Blood tests, stool tests, or breath tests to check for the H. pylori bacterium.  ? An X-ray exam (upper gastrointestinal series) of the esophagus, stomach, and small intestine.  ? Upper endoscopy. The health care provider examines the esophagus, stomach, and small intestine using a small flexible tube that has a video camera at the end.  ? Biopsy. A tissue sample is removed to be examined under a microscope.    How is this treated?  Treatment for this condition may include:   Eliminating the cause of the ulcer, such as smoking or the use of NSAIDs or  alcohol.   Medicines to reduce the amount of acid in your digestive tract.   Antibiotic medicines, if the ulcer is caused by the H. pylori bacterium.   An upper endoscopy to treat a bleeding ulcer.   Surgery, if the bleeding is severe or if the ulcer created a hole somewhere in the digestive system.    Follow these instructions at home:   Avoid alcohol and caffeine.   Do not use any tobacco products, such as cigarettes, chewing tobacco, and e-cigarettes. If you need help quitting, ask your health care provider.   Take over-the-counter and prescription medicines only as told by your health care provider. Do not use over-the-counter medicines in place of prescription medicines unless your health care provider approves.   Keep all follow-up visits as told by your health care provider. This is important.  Contact a health care provider if:   Your symptoms do not improve within 7 days of starting treatment.   You have ongoing indigestion or heartburn.  Get help right away if:   You have sudden, sharp, or persistent pain in your abdomen.   You have bloody or dark black, tarry stools.   You vomit blood or material that looks like coffee grounds.   You become light-headed or you feel faint.   You become weak.   You become sweaty or clammy.    This information is not intended to replace advice given to you by your health care provider. Make sure you discuss any questions you have with your health care provider.  Document Released: 11/24/2000 Document Revised: 05/01/2016 Document Reviewed: 08/28/2015  Elsevier Interactive Patient Education  2018 [employer]

## 2017-08-22 ENCOUNTER — Telehealth: Payer: Self-pay | Admitting: Family

## 2017-08-22 LAB — CBC WITH DIFFERENTIAL/PLATELET
BASOS: 1 %
Basophils Absolute: 0 10*3/uL (ref 0.0–0.2)
EOS (ABSOLUTE): 0.1 10*3/uL (ref 0.0–0.4)
EOS: 2 %
HEMATOCRIT: 43.6 % (ref 34.0–46.6)
Hemoglobin: 14.4 g/dL (ref 11.1–15.9)
IMMATURE GRANS (ABS): 0 10*3/uL (ref 0.0–0.1)
IMMATURE GRANULOCYTES: 0 %
Lymphocytes Absolute: 2.2 10*3/uL (ref 0.7–3.1)
Lymphs: 28 %
MCH: 29.6 pg (ref 26.6–33.0)
MCHC: 33 g/dL (ref 31.5–35.7)
MCV: 90 fL (ref 79–97)
MONOS ABS: 0.6 10*3/uL (ref 0.1–0.9)
Monocytes: 7 %
NEUTROS PCT: 62 %
Neutrophils Absolute: 5 10*3/uL (ref 1.4–7.0)
Platelets: 267 10*3/uL (ref 150–379)
RBC: 4.87 x10E6/uL (ref 3.77–5.28)
RDW: 13.8 % (ref 12.3–15.4)
WBC: 8 10*3/uL (ref 3.4–10.8)

## 2017-08-22 LAB — CMP14+EGFR
A/G RATIO: 1.7 (ref 1.2–2.2)
ALBUMIN: 4.4 g/dL (ref 3.5–5.5)
ALT: 17 IU/L (ref 0–32)
AST: 20 IU/L (ref 0–40)
Alkaline Phosphatase: 107 IU/L (ref 39–117)
BUN / CREAT RATIO: 19 (ref 9–23)
BUN: 13 mg/dL (ref 6–24)
Bilirubin Total: 0.3 mg/dL (ref 0.0–1.2)
CALCIUM: 9.8 mg/dL (ref 8.7–10.2)
CO2: 23 mmol/L (ref 20–29)
Chloride: 101 mmol/L (ref 96–106)
Creatinine, Ser: 0.67 mg/dL (ref 0.57–1.00)
GFR, EST AFRICAN AMERICAN: 116 mL/min/{1.73_m2} (ref >59)
GFR, EST NON AFRICAN AMERICAN: 101 mL/min/{1.73_m2} (ref >59)
GLOBULIN, TOTAL: 2.6 g/dL (ref 1.5–4.5)
Glucose: 90 mg/dL (ref 65–99)
POTASSIUM: 4.9 mmol/L (ref 3.5–5.2)
SODIUM: 140 mmol/L (ref 134–144)
TOTAL PROTEIN: 7 g/dL (ref 6.0–8.5)

## 2017-08-22 LAB — H PYLORI, IGM, IGG, IGA AB
H pylori, IgM Abs: <9 units (ref 0.0–8.9)
H. pylori, IgA Abs: 19.3 units — ABNORMAL HIGH (ref 0.0–8.9)
H. pylori, IgG AbS: >9.4 Index Value — ABNORMAL HIGH (ref 0.00–0.79)

## 2017-08-22 LAB — AMYLASE: Amylase: 31 U/L (ref 31–124)

## 2017-08-22 LAB — LIPASE: Lipase: 48 U/L (ref 14–72)

## 2017-08-22 NOTE — Telephone Encounter (Signed)
Left message for patient, no results back yet.

## 2017-08-23 ENCOUNTER — Other Ambulatory Visit: Payer: Self-pay | Admitting: Family

## 2017-08-23 MED ORDER — AMOXICILLIN 500 MG PO TABS: 1000.0000 mg | ORAL_TABLET | Freq: Two times a day (BID) | ORAL | 0 refills | Status: DC

## 2017-08-23 MED ORDER — CLARITHROMYCIN 500 MG PO TABS: 500.0000 mg | ORAL_TABLET | Freq: Two times a day (BID) | ORAL | 0 refills | Status: AC

## 2017-08-27 DIAGNOSIS — E559 Vitamin D deficiency, unspecified: Secondary | ICD-10-CM | POA: Diagnosis not present

## 2017-08-27 DIAGNOSIS — Z79899 Other long term (current) drug therapy: Secondary | ICD-10-CM | POA: Diagnosis not present

## 2017-08-27 DIAGNOSIS — E782 Mixed hyperlipidemia: Secondary | ICD-10-CM | POA: Diagnosis not present

## 2017-08-27 DIAGNOSIS — Z139 Encounter for screening, unspecified: Secondary | ICD-10-CM | POA: Diagnosis not present

## 2017-08-31 DIAGNOSIS — Z049 Encounter for examination and observation for unspecified reason: Secondary | ICD-10-CM | POA: Diagnosis not present

## 2017-09-05 DIAGNOSIS — E782 Mixed hyperlipidemia: Secondary | ICD-10-CM | POA: Diagnosis not present

## 2017-09-05 DIAGNOSIS — E559 Vitamin D deficiency, unspecified: Secondary | ICD-10-CM | POA: Diagnosis not present

## 2017-09-05 DIAGNOSIS — K219 Gastro-esophageal reflux disease without esophagitis: Secondary | ICD-10-CM | POA: Diagnosis not present

## 2017-09-05 DIAGNOSIS — R7301 Impaired fasting glucose: Secondary | ICD-10-CM | POA: Diagnosis not present

## 2017-10-02 DIAGNOSIS — L648 Other androgenic alopecia: Secondary | ICD-10-CM | POA: Diagnosis not present

## 2017-10-02 DIAGNOSIS — L218 Other seborrheic dermatitis: Secondary | ICD-10-CM | POA: Diagnosis not present

## 2017-10-03 DIAGNOSIS — Z7689 Persons encountering health services in other specified circumstances: Secondary | ICD-10-CM | POA: Diagnosis not present

## 2017-10-03 DIAGNOSIS — Z711 Person with feared health complaint in whom no diagnosis is made: Secondary | ICD-10-CM | POA: Diagnosis not present

## 2017-10-03 DIAGNOSIS — Z6834 Body mass index (BMI) 34.0-34.9, adult: Secondary | ICD-10-CM | POA: Diagnosis not present

## 2017-10-03 DIAGNOSIS — E669 Obesity, unspecified: Secondary | ICD-10-CM | POA: Diagnosis not present

## 2017-10-19 ENCOUNTER — Encounter: Payer: Self-pay | Admitting: Family Medicine

## 2017-10-19 ENCOUNTER — Ambulatory Visit: Payer: BLUE CROSS/BLUE SHIELD | Admitting: Family Medicine

## 2017-10-19 VITALS — BP 138/81 | HR 74 | Temp 97.7°F | Ht 63.0 in | Wt 197.6 lb

## 2017-10-19 DIAGNOSIS — J0100 Acute maxillary sinusitis, unspecified: Secondary | ICD-10-CM | POA: Diagnosis not present

## 2017-10-19 MED ORDER — AZITHROMYCIN 250 MG PO TABS: ORAL_TABLET | ORAL | 0 refills | Status: DC

## 2017-10-19 NOTE — Progress Notes (Signed)
BP 138/81   Pulse 74   Temp 97.7 F (36.5 C) (Oral)   Ht 5\' 3"  (1.6 m)   Wt 197 lb 9.6 oz (89.6 kg)   BMI 35.00 kg/m    Subjective:    Patient ID: Grace Gomez, female    DOB: 08/22/1966, 51 y.o.   MRN: [id number]  HPI: Grace Gomez is a 51 y.o. female presenting on 10/19/2017 for Sore Throat (since yesterday, feels warm at times); Cough (non productive); and Ear Pain (right)   HPI Sinus congestion ear pain and chest congestion Patient comes in today with complaints of sinus congestion and ear congestion and chest congestion that is been going on for the past 2-3 days.  She says that she has been using Claritin but it does not seem to be helping.  She feels like she is in a lot of drainage on the back of her throat and then has gone down into her chest.  She says she is also starting to get a lot of pressure in her right ear.  She denies any fevers or chills or shortness of breath but thinks she may have had some wheezing.  Relevant past medical, surgical, family and social history reviewed and updated as indicated. Interim medical history since our last visit reviewed. Allergies and medications reviewed and updated.  Review of Systems  Constitutional: Negative for chills and fever.  HENT: Positive for congestion, postnasal drip, rhinorrhea, sinus pressure, sneezing and sore throat. Negative for ear discharge and ear pain.   Eyes: Negative for pain, redness and visual disturbance.  Respiratory: Positive for cough, chest tightness and wheezing. Negative for shortness of breath.   Cardiovascular: Negative for chest pain and leg swelling.  Genitourinary: Negative for difficulty urinating and dysuria.  Musculoskeletal: Negative for back pain and gait problem.  Skin: Negative for rash.  Neurological: Negative for light-headedness and headaches.  Psychiatric/Behavioral: Negative for agitation and behavioral problems.  All other systems reviewed and are negative.   Per HPI unless  specifically indicated above        Objective:    BP 138/81   Pulse 74   Temp 97.7 F (36.5 C) (Oral)   Ht 5\' 3"  (1.6 m)   Wt 197 lb 9.6 oz (89.6 kg)   BMI 35.00 kg/m   Wt Readings from Last 3 Encounters:  10/19/17 197 lb 9.6 oz (89.6 kg)  08/21/17 193 lb (87.5 kg)  03/01/17 192 lb (87.1 kg)    Physical Exam  Constitutional: She is oriented to person, place, and time. She appears well-developed and well-nourished. No distress.  HENT:  Right Ear: Tympanic membrane, external ear and ear canal normal.  Left Ear: Tympanic membrane, external ear and ear canal normal.  Nose: Mucosal edema and rhinorrhea present. No epistaxis. Right sinus exhibits no maxillary sinus tenderness and no frontal sinus tenderness. Left sinus exhibits no maxillary sinus tenderness and no frontal sinus tenderness.  Mouth/Throat: Uvula is midline and mucous membranes are normal. Posterior oropharyngeal edema and posterior oropharyngeal erythema present. No oropharyngeal exudate or tonsillar abscesses.  Eyes: Conjunctivae are normal.  Neck: Neck supple. No thyromegaly present.  Cardiovascular: Normal rate, regular rhythm, normal heart sounds and intact distal pulses.  No murmur heard. Pulmonary/Chest: Effort normal and breath sounds normal. No respiratory distress. She has no wheezes. She has no rales.  Musculoskeletal: Normal range of motion. She exhibits no edema or tenderness.  Lymphadenopathy:    She has no cervical adenopathy.  Neurological: She is alert and  oriented to person, place, and time. Coordination normal.  Skin: Skin is warm and dry. No rash noted. She is not diaphoretic.  Psychiatric: She has a normal mood and affect. Her behavior is normal.  Vitals reviewed.     Assessment & Plan:   Problem List Items Addressed This Visit    None    Visit Diagnoses    Acute non-recurrent maxillary sinusitis    -  Primary   Relevant Medications   azithromycin (ZITHROMAX) 250 MG tablet      Recommended Flonase and azithromycin for sinus congestion and probable bronchitis.  Follow up plan: Return if symptoms worsen or fail to improve.  Counseling provided for all of the vaccine components No orders of the defined types were placed in this encounter.   Caryl Pina, MD [facility] 10/19/2017, 5:40 PM

## 2017-10-22 DIAGNOSIS — E669 Obesity, unspecified: Secondary | ICD-10-CM | POA: Diagnosis not present

## 2017-10-22 DIAGNOSIS — Z6834 Body mass index (BMI) 34.0-34.9, adult: Secondary | ICD-10-CM | POA: Diagnosis not present

## 2017-10-22 DIAGNOSIS — Z7689 Persons encountering health services in other specified circumstances: Secondary | ICD-10-CM | POA: Diagnosis not present

## 2017-10-31 DIAGNOSIS — Z7689 Persons encountering health services in other specified circumstances: Secondary | ICD-10-CM | POA: Diagnosis not present

## 2017-10-31 DIAGNOSIS — E669 Obesity, unspecified: Secondary | ICD-10-CM | POA: Diagnosis not present

## 2017-10-31 DIAGNOSIS — Z6834 Body mass index (BMI) 34.0-34.9, adult: Secondary | ICD-10-CM | POA: Diagnosis not present

## 2017-11-05 DIAGNOSIS — Z7689 Persons encountering health services in other specified circumstances: Secondary | ICD-10-CM | POA: Diagnosis not present

## 2017-11-05 DIAGNOSIS — E669 Obesity, unspecified: Secondary | ICD-10-CM | POA: Diagnosis not present

## 2017-11-05 DIAGNOSIS — Z6834 Body mass index (BMI) 34.0-34.9, adult: Secondary | ICD-10-CM | POA: Diagnosis not present

## 2017-11-28 DIAGNOSIS — E669 Obesity, unspecified: Secondary | ICD-10-CM | POA: Diagnosis not present

## 2017-11-28 DIAGNOSIS — Z6834 Body mass index (BMI) 34.0-34.9, adult: Secondary | ICD-10-CM | POA: Diagnosis not present

## 2017-11-28 DIAGNOSIS — R21 Rash and other nonspecific skin eruption: Secondary | ICD-10-CM | POA: Diagnosis not present

## 2017-11-28 DIAGNOSIS — Z7689 Persons encountering health services in other specified circumstances: Secondary | ICD-10-CM | POA: Diagnosis not present

## 2017-12-24 ENCOUNTER — Encounter: Payer: BLUE CROSS/BLUE SHIELD | Admitting: Family

## 2018-01-03 ENCOUNTER — Ambulatory Visit (INDEPENDENT_AMBULATORY_CARE_PROVIDER_SITE_OTHER): Payer: BLUE CROSS/BLUE SHIELD | Admitting: Family

## 2018-01-03 ENCOUNTER — Encounter: Payer: Self-pay | Admitting: Family

## 2018-01-03 VITALS — BP 143/88 | HR 73 | Temp 97.7°F | Ht 63.0 in | Wt 199.8 lb

## 2018-01-03 DIAGNOSIS — I10 Essential (primary) hypertension: Secondary | ICD-10-CM | POA: Diagnosis not present

## 2018-01-03 DIAGNOSIS — Z0000 Encounter for general adult medical examination without abnormal findings: Secondary | ICD-10-CM | POA: Diagnosis not present

## 2018-01-03 DIAGNOSIS — E785 Hyperlipidemia, unspecified: Secondary | ICD-10-CM

## 2018-01-03 DIAGNOSIS — K219 Gastro-esophageal reflux disease without esophagitis: Secondary | ICD-10-CM | POA: Diagnosis not present

## 2018-01-03 DIAGNOSIS — E559 Vitamin D deficiency, unspecified: Secondary | ICD-10-CM

## 2018-01-03 DIAGNOSIS — Z01419 Encounter for gynecological examination (general) (routine) without abnormal findings: Secondary | ICD-10-CM

## 2018-01-03 DIAGNOSIS — R87610 Atypical squamous cells of undetermined significance on cytologic smear of cervix (ASC-US): Secondary | ICD-10-CM | POA: Diagnosis not present

## 2018-01-03 LAB — MICROSCOPIC EXAMINATION
Epithelial Cells (non renal): >10 /hpf — AB (ref 0–10)
RENAL EPITHEL UA: NONE SEEN /HPF

## 2018-01-03 LAB — URINALYSIS, COMPLETE
Bilirubin, UA: NEGATIVE
Glucose, UA: NEGATIVE
Ketones, UA: NEGATIVE
LEUKOCYTES UA: NEGATIVE
Nitrite, UA: NEGATIVE
PH UA: 5.5 (ref 5.0–7.5)
Protein, UA: NEGATIVE
Specific Gravity, UA: 1.025 (ref 1.005–1.030)
Urobilinogen, Ur: 0.2 mg/dL (ref 0.2–1.0)

## 2018-01-03 MED ORDER — NYSTATIN-TRIAMCINOLONE 100000-0.1 UNIT/GM-% EX OINT: 1.0000 "application " | TOPICAL_OINTMENT | Freq: Two times a day (BID) | CUTANEOUS | 0 refills | Status: DC

## 2018-01-03 MED ORDER — VITAMIN D (ERGOCALCIFEROL) 1.25 MG (50000 UNIT) PO CAPS: 50000.0000 [IU] | ORAL_CAPSULE | ORAL | 3 refills | Status: DC

## 2018-01-03 NOTE — Patient Instructions (Signed)

## 2018-01-03 NOTE — Progress Notes (Signed)
Subjective:    Patient ID: Grace Gomez, female    DOB: [birth year], 52 y.o.   MRN: [id number]  Pt presents to the office today for CPE with pap.  Gynecologic Exam  The patient's primary symptoms include vaginal discharge. The patient's pertinent negatives include no genital itching or genital odor. Pertinent negatives include no headaches.  Hypertension  This is a chronic problem. The current episode started more than 1 year ago. The problem has been waxing and waning since onset. The problem is uncontrolled. Pertinent negatives include no headaches, malaise/fatigue, peripheral edema or shortness of breath. Risk factors for coronary artery disease include dyslipidemia, obesity, family history and sedentary lifestyle. The current treatment provides mild improvement. There is no history of kidney disease, CAD/MI, CVA or heart failure.  Gastroesophageal Reflux  She reports no belching, no coughing or no heartburn. This is a chronic problem. The current episode started more than 1 year ago. The problem occurs occasionally. The problem has been waxing and waning. The symptoms are aggravated by certain foods. She has tried a PPI for the symptoms. The treatment provided moderate relief.  Hyperlipidemia  This is a chronic problem. The current episode started more than 1 year ago. The problem is uncontrolled. Recent lipid tests were reviewed and are high. Exacerbating diseases include obesity. Pertinent negatives include no shortness of breath. Current antihyperlipidemic treatment includes diet change. The current treatment provides no improvement of lipids. Risk factors for coronary artery disease include dyslipidemia, family history, hypertension, a sedentary lifestyle and post-menopausal.      Review of Systems  Constitutional: Negative for malaise/fatigue.  Respiratory: Negative for cough and shortness of breath.   Gastrointestinal: Negative for heartburn.  Genitourinary: Positive for vaginal  discharge.  Neurological: Negative for headaches.  All other systems reviewed and are negative.      Objective:   Physical Exam  Constitutional: She is oriented to person, place, and time. She appears well-developed and well-nourished. No distress.  HENT:  Head: Normocephalic and atraumatic.  Right Ear: External ear normal.  Left Ear: External ear normal.  Nose: Nose normal.  Mouth/Throat: Oropharynx is clear and moist.  Eyes: Pupils are equal, round, and reactive to light.  Neck: Normal range of motion. Neck supple. No thyromegaly present.  Cardiovascular: Normal rate, regular rhythm, normal heart sounds and intact distal pulses.  No murmur heard. Pulmonary/Chest: Effort normal and breath sounds normal. No respiratory distress. She has no wheezes. Right breast exhibits no inverted nipple, no mass, no nipple discharge, no skin change and no tenderness. Left breast exhibits no inverted nipple, no mass, no nipple discharge, no skin change and no tenderness. Breasts are symmetrical.  Abdominal: Soft. Bowel sounds are normal. She exhibits no distension. There is no tenderness.  Genitourinary: Vagina normal.  Genitourinary Comments: Bimanual exam- no adnexal masses or tenderness, ovaries nonpalpable   Cervix parous and pink- No discharge   Musculoskeletal: Normal range of motion. She exhibits no edema or tenderness.  Neurological: She is alert and oriented to person, place, and time.  Skin: Skin is warm and dry.  Psychiatric: She has a normal mood and affect. Her behavior is normal. Judgment and thought content normal.  Vitals reviewed.     BP (!) 143/88   Pulse 73   Temp 97.7 F (36.5 C) (Oral)   Ht 5' 3" (1.6 m)   Wt 199 lb 12.8 oz (90.6 kg)   BMI 35.39 kg/m      Assessment & Plan:  1. Gynecologic exam normal -  Urinalysis, Complete - CMP14+EGFR - Pap IG w/ reflex to HPV when ASC-U  2. Annual physical exam - Anemia Profile B - CMP14+EGFR - Lipid panel - Thyroid  Panel With TSH - Hepatitis C antibody - VITAMIN D 25 Hydroxy (Vit-D Deficiency, Fractures) - Pap IG w/ reflex to HPV when ASC-U  3. Essential hypertension - CMP14+EGFR  4. Gastroesophageal reflux disease, esophagitis presence not specified - CMP14+EGFR  5. Hyperlipidemia, unspecified hyperlipidemia type - CMP14+EGFR  6. Vitamin D deficiency - CMP14+EGFR - VITAMIN D 25 Hydroxy (Vit-D Deficiency, Fractures)   Continue all meds Labs pending Health Maintenance reviewed Diet and exercise encouraged RTO 6 months   Evelina Dun, FNP

## 2018-01-04 DIAGNOSIS — Z1231 Encounter for screening mammogram for malignant neoplasm of breast: Secondary | ICD-10-CM | POA: Diagnosis not present

## 2018-01-04 LAB — ANEMIA PROFILE B
BASOS: 1 %
Basophils Absolute: 0 10*3/uL (ref 0.0–0.2)
EOS (ABSOLUTE): 0.1 10*3/uL (ref 0.0–0.4)
EOS: 2 %
FERRITIN: 78 ng/mL (ref 15–150)
Folate: 6.7 ng/mL (ref >3.0)
HEMATOCRIT: 40.5 % (ref 34.0–46.6)
Hemoglobin: 13.5 g/dL (ref 11.1–15.9)
IMMATURE GRANS (ABS): 0 10*3/uL (ref 0.0–0.1)
IRON SATURATION: 21 % (ref 15–55)
Immature Granulocytes: 0 %
Iron: 54 ug/dL (ref 27–159)
LYMPHS: 31 %
Lymphocytes Absolute: 2.5 10*3/uL (ref 0.7–3.1)
MCH: 29.7 pg (ref 26.6–33.0)
MCHC: 33.3 g/dL (ref 31.5–35.7)
MCV: 89 fL (ref 79–97)
MONOS ABS: 0.4 10*3/uL (ref 0.1–0.9)
Monocytes: 5 %
NEUTROS ABS: 5 10*3/uL (ref 1.4–7.0)
Neutrophils: 61 %
Platelets: 295 10*3/uL (ref 150–379)
RBC: 4.54 x10E6/uL (ref 3.77–5.28)
RDW: 13.5 % (ref 12.3–15.4)
Retic Ct Pct: 1.4 % (ref 0.6–2.6)
Total Iron Binding Capacity: 262 ug/dL (ref 250–450)
UIBC: 208 ug/dL (ref 131–425)
VITAMIN B 12: 410 pg/mL (ref 232–1245)
WBC: 8.1 10*3/uL (ref 3.4–10.8)

## 2018-01-04 LAB — HEPATITIS C ANTIBODY: Hep C Virus Ab: <0.1 s/co ratio (ref 0.0–0.9)

## 2018-01-04 LAB — CMP14+EGFR
A/G RATIO: 1.6 (ref 1.2–2.2)
ALT: 16 IU/L (ref 0–32)
AST: 17 IU/L (ref 0–40)
Albumin: 4.1 g/dL (ref 3.5–5.5)
Alkaline Phosphatase: 107 IU/L (ref 39–117)
BILIRUBIN TOTAL: 0.2 mg/dL (ref 0.0–1.2)
BUN/Creatinine Ratio: 22 (ref 9–23)
BUN: 15 mg/dL (ref 6–24)
CALCIUM: 9.4 mg/dL (ref 8.7–10.2)
CO2: 23 mmol/L (ref 20–29)
Chloride: 104 mmol/L (ref 96–106)
Creatinine, Ser: 0.68 mg/dL (ref 0.57–1.00)
GFR, EST AFRICAN AMERICAN: 115 mL/min/{1.73_m2} (ref >59)
GFR, EST NON AFRICAN AMERICAN: 100 mL/min/{1.73_m2} (ref >59)
GLOBULIN, TOTAL: 2.6 g/dL (ref 1.5–4.5)
Glucose: 96 mg/dL (ref 65–99)
POTASSIUM: 4.7 mmol/L (ref 3.5–5.2)
SODIUM: 140 mmol/L (ref 134–144)
TOTAL PROTEIN: 6.7 g/dL (ref 6.0–8.5)

## 2018-01-04 LAB — LIPID PANEL
Chol/HDL Ratio: 4.6 ratio — ABNORMAL HIGH (ref 0.0–4.4)
Cholesterol, Total: 212 mg/dL — ABNORMAL HIGH (ref 100–199)
HDL: 46 mg/dL (ref >39)
LDL Calculated: 137 mg/dL — ABNORMAL HIGH (ref 0–99)
Triglycerides: 146 mg/dL (ref 0–149)
VLDL Cholesterol Cal: 29 mg/dL (ref 5–40)

## 2018-01-04 LAB — THYROID PANEL WITH TSH
Free Thyroxine Index: 1.5 (ref 1.2–4.9)
T3 UPTAKE RATIO: 23 % — AB (ref 24–39)
T4 TOTAL: 6.5 ug/dL (ref 4.5–12.0)
TSH: 1.95 u[IU]/mL (ref 0.450–4.500)

## 2018-01-04 LAB — VITAMIN D 25 HYDROXY (VIT D DEFICIENCY, FRACTURES): Vit D, 25-Hydroxy: 24.5 ng/mL — ABNORMAL LOW (ref 30.0–100.0)

## 2018-01-07 ENCOUNTER — Encounter: Payer: Self-pay | Admitting: *Deleted

## 2018-01-07 ENCOUNTER — Other Ambulatory Visit: Payer: Self-pay | Admitting: *Deleted

## 2018-01-07 ENCOUNTER — Other Ambulatory Visit: Payer: Self-pay | Admitting: Family

## 2018-01-07 MED ORDER — ATORVASTATIN CALCIUM 20 MG PO TABS: 20.0000 mg | ORAL_TABLET | Freq: Every day | ORAL | 11 refills | Status: DC

## 2018-01-07 MED ORDER — VITAMIN D (ERGOCALCIFEROL) 1.25 MG (50000 UNIT) PO CAPS: 50000.0000 [IU] | ORAL_CAPSULE | ORAL | 3 refills | Status: DC

## 2018-01-08 ENCOUNTER — Other Ambulatory Visit: Payer: Self-pay | Admitting: Family

## 2018-01-08 LAB — PAP IG W/ RFLX HPV ASCU: PAP Smear Comment: 0

## 2018-01-08 LAB — HPV DNA PROBE HIGH RISK, AMPLIFIED: HPV, high-risk: NEGATIVE

## 2018-01-14 DIAGNOSIS — E669 Obesity, unspecified: Secondary | ICD-10-CM | POA: Diagnosis not present

## 2018-01-14 DIAGNOSIS — E559 Vitamin D deficiency, unspecified: Secondary | ICD-10-CM | POA: Diagnosis not present

## 2018-01-14 DIAGNOSIS — E782 Mixed hyperlipidemia: Secondary | ICD-10-CM | POA: Diagnosis not present

## 2018-01-14 DIAGNOSIS — Z719 Counseling, unspecified: Secondary | ICD-10-CM | POA: Diagnosis not present

## 2018-01-14 DIAGNOSIS — Z008 Encounter for other general examination: Secondary | ICD-10-CM | POA: Diagnosis not present

## 2018-01-18 DIAGNOSIS — J069 Acute upper respiratory infection, unspecified: Secondary | ICD-10-CM | POA: Diagnosis not present

## 2018-01-18 DIAGNOSIS — I10 Essential (primary) hypertension: Secondary | ICD-10-CM | POA: Diagnosis not present

## 2018-01-26 ENCOUNTER — Ambulatory Visit: Payer: BLUE CROSS/BLUE SHIELD | Admitting: Family Medicine

## 2018-01-26 ENCOUNTER — Encounter: Payer: Self-pay | Admitting: Family Medicine

## 2018-01-26 VITALS — BP 135/78 | HR 87 | Temp 97.6°F | Ht 63.0 in | Wt 199.0 lb

## 2018-01-26 DIAGNOSIS — J0101 Acute recurrent maxillary sinusitis: Secondary | ICD-10-CM | POA: Diagnosis not present

## 2018-01-26 MED ORDER — AZITHROMYCIN 250 MG PO TABS: ORAL_TABLET | ORAL | 0 refills | Status: DC

## 2018-01-26 NOTE — Progress Notes (Signed)
BP 135/78   Pulse 87   Temp 97.6 F (36.4 C) (Oral)   Ht 5\' 3"  (1.6 m)   Wt 199 lb (90.3 kg)   BMI 35.25 kg/m    Subjective:    Patient ID: Grace Gomez, female    DOB: 12-19-1965, 52 y.o.   MRN: [id number]  HPI: Grace Gomez is a 52 y.o. female presenting on 01/26/2018 for Sinusitis (sinus congestion and pressure, dental pain, headache, bilateral ear pain, cough)   HPI Sinus congestion and headache and pain Patient comes in complaining of sinus congestion and headache and dental pain and pressure that is been going on for the past 2 weeks but has worsened over the past day or 2.  She had been using some Tylenol Sinus medication and Claritin but did not feel like they were helping at all and the last couple days she has been having a lot of pain and pressure in her ears and especially on the right side of her face below the eyes going down into her teeth.  She says the pain today is quite significant and is hurting a lot.  She denies any fevers or chills or body aches or shortness of breath or wheezing.  Relevant past medical, surgical, family and social history reviewed and updated as indicated. Interim medical history since our last visit reviewed. Allergies and medications reviewed and updated.  Review of Systems  Constitutional: Negative for chills and fever.  HENT: Positive for congestion, ear pain, postnasal drip, rhinorrhea, sinus pressure, sneezing and sore throat. Negative for ear discharge.   Eyes: Negative for pain, redness and visual disturbance.  Respiratory: Positive for cough. Negative for chest tightness, shortness of breath and wheezing.   Cardiovascular: Negative for chest pain and leg swelling.  Genitourinary: Negative for difficulty urinating and dysuria.  Musculoskeletal: Negative for back pain and gait problem.  Skin: Negative for rash.  Neurological: Negative for light-headedness and headaches.  Psychiatric/Behavioral: Negative for agitation and behavioral  problems.  All other systems reviewed and are negative.   Per HPI unless specifically indicated above   Allergies as of 01/26/2018      Reactions   Sulfa Antibiotics Hives      Medication List        Accurate as of 01/26/18  9:55 AM. Always use your most recent med list.          azithromycin 250 MG tablet Commonly known as:  ZITHROMAX Take 2 the first day and then one each day after.   dexlansoprazole 60 MG capsule Commonly known as:  DEXILANT Take 1 capsule (60 mg total) by mouth daily.   Vitamin D 2000 units Caps Take by mouth.          Objective:    BP 135/78   Pulse 87   Temp 97.6 F (36.4 C) (Oral)   Ht 5\' 3"  (1.6 m)   Wt 199 lb (90.3 kg)   BMI 35.25 kg/m   Wt Readings from Last 3 Encounters:  01/26/18 199 lb (90.3 kg)  01/03/18 199 lb 12.8 oz (90.6 kg)  10/19/17 197 lb 9.6 oz (89.6 kg)    Physical Exam  Constitutional: She is oriented to person, place, and time. She appears well-developed and well-nourished. No distress.  HENT:  Right Ear: Tympanic membrane, external ear and ear canal normal.  Left Ear: Tympanic membrane, external ear and ear canal normal.  Nose: Mucosal edema and rhinorrhea present. No epistaxis. Right sinus exhibits maxillary sinus tenderness. Right sinus  exhibits no frontal sinus tenderness. Left sinus exhibits no maxillary sinus tenderness and no frontal sinus tenderness.  Mouth/Throat: Uvula is midline and mucous membranes are normal. Posterior oropharyngeal edema and posterior oropharyngeal erythema present. No oropharyngeal exudate or tonsillar abscesses.  Eyes: Conjunctivae and EOM are normal.  Neck: Neck supple. No thyromegaly present.  Cardiovascular: Normal rate, regular rhythm, normal heart sounds and intact distal pulses.  No murmur heard. Pulmonary/Chest: Effort normal and breath sounds normal. No respiratory distress. She has no wheezes. She has no rales.  Lymphadenopathy:    She has no cervical adenopathy.    Neurological: She is alert and oriented to person, place, and time. Coordination normal.  Skin: Skin is warm and dry. No rash noted. She is not diaphoretic.  Psychiatric: She has a normal mood and affect. Her behavior is normal.  Vitals reviewed.       Assessment & Plan:   Problem List Items Addressed This Visit    None    Visit Diagnoses    Acute recurrent maxillary sinusitis    -  Primary   Patient has generic Flonase, recommended to use that and sent azithromycin   Relevant Medications   azithromycin (ZITHROMAX) 250 MG tablet       Follow up plan: Return if symptoms worsen or fail to improve.  Counseling provided for all of the vaccine components No orders of the defined types were placed in this encounter.   Caryl Pina, MD [facility] 01/26/2018, 9:55 AM

## 2018-02-06 ENCOUNTER — Encounter: Payer: Self-pay | Admitting: Physician Assistant

## 2018-02-14 ENCOUNTER — Other Ambulatory Visit: Payer: Self-pay

## 2018-02-14 ENCOUNTER — Ambulatory Visit: Payer: BLUE CROSS/BLUE SHIELD | Admitting: Physician Assistant

## 2018-02-14 ENCOUNTER — Encounter (INDEPENDENT_AMBULATORY_CARE_PROVIDER_SITE_OTHER): Payer: Self-pay

## 2018-02-14 ENCOUNTER — Ambulatory Visit: Payer: BLUE CROSS/BLUE SHIELD | Admitting: Family

## 2018-02-14 ENCOUNTER — Encounter: Payer: Self-pay | Admitting: Physician Assistant

## 2018-02-14 VITALS — BP 136/84 | HR 72 | Ht 62.25 in | Wt 199.0 lb

## 2018-02-14 DIAGNOSIS — R1030 Lower abdominal pain, unspecified: Secondary | ICD-10-CM

## 2018-02-14 DIAGNOSIS — R110 Nausea: Secondary | ICD-10-CM

## 2018-02-14 DIAGNOSIS — R197 Diarrhea, unspecified: Secondary | ICD-10-CM

## 2018-02-14 DIAGNOSIS — K219 Gastro-esophageal reflux disease without esophagitis: Secondary | ICD-10-CM

## 2018-02-14 NOTE — Patient Instructions (Signed)
If you are age 54 or older, your body mass index should be between 23-30. Your Body mass index is 36.11 kg/m. If this is out of the aforementioned range listed, please consider follow up with your Primary Care Provider.  If you are age 32 or younger, your body mass index should be between 19-25. Your Body mass index is 36.11 kg/m. If this is out of the aformentioned range listed, please consider follow up with your Primary Care Provider.   Your physician has requested that you go to the basement for lab work before leaving today.  Take Dexilant 60 mg every day.  Citrucel Fiber Supplement every day. ( Goal 25-35 grams fiber daily).  Water 6-8  8 ounce glasses daily.  Start IB Donald Prose - two twice daily (over-the-counter) You have been given coupons.  You have been given a Low [place] Diet handout.  Follow up with Ellouise Newer, PA-C in 4-6 weeks.  We will contact you regarding this appointment.  Thank you for choosing me and [facility].  Ellouise Newer, PA-C

## 2018-02-14 NOTE — Progress Notes (Signed)
Chief Complaint: Abdominal pain, heartburn, diarrhea  HPI:    Grace Gomez is a 52 year old Caucasian female with a past medical history as listed below, who was referred to me by Sharion Balloon, FNP for a complaint of abdominal pain, heartburn and diarrhea.      Review of outside records shows a colonoscopy at [facility] by Dr. Britta Mccreedy 09/10/15 done for chronic diarrhea, this was normal.  Recommendations were for a probiotic daily for 30 days and return in 10 years for colonoscopy.    Labs 01/03/18 show a normal TSH, iron studies, CBC, CMP.    Today, explains she was diagnosed with IBS years ago.  These symptoms continue to bother her, she describes lower abdominal cramping relieved after explosive diarrhea after certain meals, typically higher fat/greasy foods.  Also with constipation and normal stools mixed in.  Associated symptoms include bloating.  Has tried fiber in the past for 1 or 2 days which made her feel gassy.    Also describes reflux was wakes her up at night at least 2-3 nights a week.  Only takes her Dexilant currently as needed.  Associated symptoms include nausea "quite often", not necessarily related to eating.    Denies fever, chills, weight loss, blood in her stool, melena, vomiting or change in bowel habits.  Past Medical History:  Diagnosis Date  . GERD (gastroesophageal reflux disease)   . Hyperlipidemia   . Hypertension   . IBS (irritable bowel syndrome)    diarrhea    Past Surgical History:  Procedure Laterality Date  . HERNIA REPAIR    . TUBAL LIGATION  1990    Current Outpatient Medications  Medication Sig Dispense Refill  . Cholecalciferol (VITAMIN D) 2000 units CAPS Take by mouth.    . dexlansoprazole (DEXILANT) 60 MG capsule Take 1 capsule (60 mg total) by mouth daily. 90 capsule 1   No current facility-administered medications for this visit.     Allergies as of 02/14/2018 - Review Complete 02/14/2018  Allergen Reaction Noted  . Sulfa  antibiotics Hives 08/12/2013    Family History  Problem Relation Age of Onset  . Heart disease Mother 93  . Hypertension Mother   . Cancer Father   . Heart disease Brother   . Heart disease Maternal Aunt 46  . Colon cancer Maternal Grandmother   . Crohn's disease Cousin   . Irritable bowel syndrome Daughter     Social History   Socioeconomic History  . Marital status: Single    Spouse name: Not on file  . Number of children: 3  . Years of education: Not on file  . Highest education level: Not on file  Social Needs  . Financial resource strain: Not on file  . Food insecurity - worry: Not on file  . Food insecurity - inability: Not on file  . Transportation needs - medical: Not on file  . Transportation needs - non-medical: Not on file  Occupational History  . Occupation: Single Chief Financial Officer  Tobacco Use  . Smoking status: Former Smoker    Packs/day: 1.00    Types: Cigarettes    Start date: 07/06/1980    Last attempt to quit: 08/20/2001    Years since quitting: 16.4  . Smokeless tobacco: Never Used  Substance and Sexual Activity  . Alcohol use: No  . Drug use: No  . Sexual activity: Not on file  Other Topics Concern  . Not on file  Social History Narrative  . Not on file  Review of Systems:    Constitutional: No weight loss, fever or chills Skin: No rash  Cardiovascular: No chest pain Respiratory: No SOB  Gastrointestinal: See HPI and otherwise negative Genitourinary: No dysuria  Neurological: No headache, dizziness or syncope Musculoskeletal: No new muscle or joint pain Hematologic: No bleeding  Psychiatric: No history of depression or anxiety   Physical Exam:  Vital signs: Ht 5' 2.25" (1.581 m) Comment: height measured without shoes  Wt 199 lb (90.3 kg)   BMI 36.11 kg/m   Constitutional:   Pleasant overweight Caucasian female appears to be in NAD, Well developed, Well nourished, alert and cooperative Head:  Normocephalic and atraumatic. Eyes:    PEERL, EOMI. No icterus. Conjunctiva pink. Ears:  Normal auditory acuity. Neck:  Supple Throat: Oral cavity and pharynx without inflammation, swelling or lesion.  Respiratory: Respirations even and unlabored. Lungs clear to auscultation bilaterally.   No wheezes, crackles, or rhonchi.  Cardiovascular: Normal S1, S2. No MRG. Regular rate and rhythm. No peripheral edema, cyanosis or pallor.  Gastrointestinal:  Soft, nondistended,mild epigastric ttp, No rebound or guarding. Normal bowel sounds. No appreciable masses or hepatomegaly. Rectal:  Not performed.  Msk:  Symmetrical without gross deformities. Without edema, no deformity or joint abnormality.  Neurologic:  Alert and  oriented x4;  grossly normal neurologically.  Skin:   Dry and intact without significant lesions or rashes. Psychiatric:Demonstrates good judgement and reason without abnormal affect or behaviors.  MOST RECENT LABS AND IMAGING: CBC    Component Value Date/Time   WBC 8.1 01/03/2018 1135   WBC 9.3 08/20/2013 1113   RBC 4.54 01/03/2018 1135   RBC 4.5 08/20/2013 1113   HGB 13.5 01/03/2018 1135   HCT 40.5 01/03/2018 1135   PLT 295 01/03/2018 1135   MCV 89 01/03/2018 1135   MCH 29.7 01/03/2018 1135   MCH 30.8 08/20/2013 1113   MCHC 33.3 01/03/2018 1135   MCHC 35.6 (A) 08/20/2013 1113   RDW 13.5 01/03/2018 1135   LYMPHSABS 2.5 01/03/2018 1135   EOSABS 0.1 01/03/2018 1135   BASOSABS 0.0 01/03/2018 1135    CMP     Component Value Date/Time   NA 140 01/03/2018 1135   K 4.7 01/03/2018 1135   CL 104 01/03/2018 1135   CO2 23 01/03/2018 1135   GLUCOSE 96 01/03/2018 1135   BUN 15 01/03/2018 1135   CREATININE 0.68 01/03/2018 1135   CALCIUM 9.4 01/03/2018 1135   PROT 6.7 01/03/2018 1135   ALBUMIN 4.1 01/03/2018 1135   AST 17 01/03/2018 1135   ALT 16 01/03/2018 1135   ALKPHOS 107 01/03/2018 1135   BILITOT 0.2 01/03/2018 1135   GFRNONAA 100 01/03/2018 1135   GFRAA 115 01/03/2018 1135    Assessment: 1.   Diarrhea: Explosive diarrhea typically after fatty greasy meals as well as some days of constipation and regular stool; likely IBS plus/minus bile dumping from gallbladder 2.  Lower abdominal pain: Relieved after diarrhea above, most consistent with IBS 3.  GERD: Uncontrolled on as needed Dexilant 60 mg; likely element of gastritis +/- functional dyspepsia 4.  Nausea: Likely with gastritis above  Plan: 1.  Discussed with patient it sounds as though all of her symptoms are related to a mixed IBS with constipation and diarrhea.  Discussed this diagnosis. 2.  Ordered stool studies including GI pathogen panel, O&P, pancreatic elastase and lactoferrin 3.  Would recommend patient increase fiber in her diet to 25-35 g/day with use of fiber supplement such as Citrucel which is less  gas causing. 4.  Recommend patient increase water intake to the 6-8 8 ounce glasses of water per day. 5.  Recommend the patient start IB Gard 2 tabs twice daily for the next month, after this we can decrease to 1 tab twice daily and so forth.  Did provide her with a coupon. 6.  Provide the patient with a copy of the low FODMAP diet.  Did discuss that she can wait to start this and see if the above measures help first. 7.  Patient to follow with me in 4-6 weeks.  She was assigned to Dr. Fuller Plan.  Ellouise Newer, PA-C [facility] 02/14/2018, 9:19 AM  Cc: Sharion Balloon, FNP

## 2018-02-14 NOTE — Progress Notes (Signed)
Reviewed and agree with initial management plan.  Coralee Edberg T. Idona Stach, MD FACG 

## 2018-02-15 DIAGNOSIS — K589 Irritable bowel syndrome without diarrhea: Secondary | ICD-10-CM | POA: Diagnosis not present

## 2018-03-01 DIAGNOSIS — K219 Gastro-esophageal reflux disease without esophagitis: Secondary | ICD-10-CM | POA: Diagnosis not present

## 2018-03-01 DIAGNOSIS — K589 Irritable bowel syndrome without diarrhea: Secondary | ICD-10-CM | POA: Diagnosis not present

## 2018-03-22 ENCOUNTER — Encounter: Payer: Self-pay | Admitting: Physician Assistant

## 2018-03-22 ENCOUNTER — Ambulatory Visit: Payer: Self-pay | Admitting: Physician Assistant

## 2018-03-22 ENCOUNTER — Ambulatory Visit: Payer: BLUE CROSS/BLUE SHIELD | Admitting: Physician Assistant

## 2018-03-22 ENCOUNTER — Encounter (INDEPENDENT_AMBULATORY_CARE_PROVIDER_SITE_OTHER): Payer: Self-pay

## 2018-03-22 VITALS — BP 118/70 | HR 72 | Ht 62.5 in | Wt 198.4 lb

## 2018-03-22 DIAGNOSIS — R1030 Lower abdominal pain, unspecified: Secondary | ICD-10-CM

## 2018-03-22 DIAGNOSIS — R110 Nausea: Secondary | ICD-10-CM | POA: Diagnosis not present

## 2018-03-22 DIAGNOSIS — R197 Diarrhea, unspecified: Secondary | ICD-10-CM | POA: Diagnosis not present

## 2018-03-22 DIAGNOSIS — K219 Gastro-esophageal reflux disease without esophagitis: Secondary | ICD-10-CM | POA: Diagnosis not present

## 2018-03-22 MED ORDER — DEXLANSOPRAZOLE 60 MG PO CPDR: 60.0000 mg | DELAYED_RELEASE_CAPSULE | Freq: Every day | ORAL | 11 refills | Status: DC

## 2018-03-22 NOTE — Patient Instructions (Signed)
If you are age 54 or older, your body mass index should be between 23-30. Your Body mass index is 35.71 kg/m. If this is out of the aforementioned range listed, please consider follow up with your Primary Care Provider.  If you are age 14 or younger, your body mass index should be between 19-25. Your Body mass index is 35.71 kg/m. If this is out of the aformentioned range listed, please consider follow up with your Primary Care Provider.   We have sent the following medications to your pharmacy for you to pick up at your convenience: Dexilant 60 mg   Continue IBgard.  You have been samples and coupons.  Follow up in one year if needed.  Thank you for choosing me and [facility].  Ellouise Newer, PA-C

## 2018-03-22 NOTE — Progress Notes (Signed)
Chief Complaint: Follow-up of abdominal pain, heartburn and diarrhea  HPI:     Grace Gomez is a 52 year old Caucasian female with a past medical history as listed below, assigned to Dr. Fuller Plan at last visit, and presents to clinic today for follow-up of her abdominal pain, heartburn and diarrhea.    02/14/18 office visit, described diagnosis of IBS and a lower abdominal cramping with explosive diarrhea as well as reflux.  At that time instructed to use her Dexilant 60 mg daily, also start the patient on IB Gard 2 tabs twice a day.  Discussed IBS.  Also discussed Citrucel.    Today, explains that her symptoms are all resolved.  She is now using her Dexilant 60 mg daily and has no burn heartburn or reflux symptoms.  Also describes using 2 tabs of IB Gard twice daily and has had no further explosive diarrhea, in fact she sometimes becomes "almost constipated".  Describes solid bowel movements.  Patient did try decreasing the dosage to 1 tab twice a day but had further diarrhea, so she has returned to regular dosing.  Denies any further lower abdominal cramping.  Does discuss she tried fiber supplement which was "not for me".    Social history for recent [employer] concert with grandson.    Denies fever, chills, blood in her stool, melena, weight loss, anorexia, nausea, vomiting or symptoms that awaken her at night.  Past Medical History:  Diagnosis Date  . GERD (gastroesophageal reflux disease)   . Hyperlipidemia   . Hypertension   . IBS (irritable bowel syndrome)    diarrhea    Past Surgical History:  Procedure Laterality Date  . HERNIA REPAIR    . TUBAL LIGATION  1990    Current Outpatient Medications  Medication Sig Dispense Refill  . Cholecalciferol (VITAMIN D) 2000 units CAPS Take by mouth.    . dexlansoprazole (DEXILANT) 60 MG capsule Take 1 capsule (60 mg total) by mouth daily. 30 capsule 11  . Peppermint Oil (IBGARD PO) Take 1 capsule by mouth every morning. Take 2 caps in the morning 30  min before breakfast, take 2 caps with dinner.     No current facility-administered medications for this visit.     Allergies as of 03/22/2018 - Review Complete 03/22/2018  Allergen Reaction Noted  . Sulfa antibiotics Hives 08/12/2013    Family History  Problem Relation Age of Onset  . Heart disease Mother 42  . Hypertension Mother   . Cancer Father   . Heart disease Brother   . Heart disease Maternal Aunt 46  . Colon cancer Maternal Grandmother   . Crohn's disease Cousin   . Irritable bowel syndrome Daughter     Social History   Socioeconomic History  . Marital status: Single    Spouse name: Not on file  . Number of children: 3  . Years of education: Not on file  . Highest education level: Not on file  Occupational History  . Occupation: Doctor, general practice  Social Needs  . Financial resource strain: Not on file  . Food insecurity:    Worry: Not on file    Inability: Not on file  . Transportation needs:    Medical: Not on file    Non-medical: Not on file  Tobacco Use  . Smoking status: Former Smoker    Packs/day: 1.00    Types: Cigarettes    Start date: 07/06/1980    Last attempt to quit: 08/20/2001    Years since quitting: 16.5  .  Smokeless tobacco: Never Used  Substance and Sexual Activity  . Alcohol use: No  . Drug use: No  . Sexual activity: Not on file  Lifestyle  . Physical activity:    Days per week: Not on file    Minutes per session: Not on file  . Stress: Not on file  Relationships  . Social connections:    Talks on phone: Not on file    Gets together: Not on file    Attends religious service: Not on file    Active member of club or organization: Not on file    Attends meetings of clubs or organizations: Not on file    Relationship status: Not on file  . Intimate partner violence:    Fear of current or ex partner: Not on file    Emotionally abused: Not on file    Physically abused: Not on file    Forced sexual activity: Not on file    Other Topics Concern  . Not on file  Social History Narrative  . Not on file    Review of Systems:    Constitutional: No weight loss, fever or chills Cardiovascular: No chest pain  Respiratory: No SOB Gastrointestinal: See HPI and otherwise negative   Physical Exam:  Vital signs: BP 118/70   Pulse 72   Ht 5' 2.5" (1.588 m)   Wt 198 lb 6.4 oz (90 kg)   LMP 01/18/2018   BMI 35.71 kg/m   Constitutional:   Very Pleasant overweight Caucasian female appears to be in NAD, Well developed, Well nourished, alert and cooperative Respiratory: Respirations even and unlabored. Lungs clear to auscultation bilaterally.   No wheezes, crackles, or rhonchi.  Cardiovascular: Normal S1, S2. No MRG. Regular rate and rhythm. No peripheral edema, cyanosis or pallor.  Gastrointestinal:  Soft, nondistended, nontender. No rebound or guarding. Normal bowel sounds. No appreciable masses or hepatomegaly. Psychiatric: Demonstrates good judgement and reason without abnormal affect or behaviors.  No recent labs or imaging.  Assessment: 1.  Diarrhea: Thought related to IBS with lower abdominal cramping below, resolved with IB Gard 2 tabs twice daily 2.  Lower abdominal pain: Resolved 3.  GERD: Resolved with Dexilant 60 mg daily 4.  Nausea: Resolved  Plan: 1.  All symptoms resolved with IB Gard 2 tabs twice daily and Dexilant 60 mg daily.  Continue these for now.  Provided year refill of Dexilant and more coupons for IB [place]. 2.  Patient to follow in clinic as needed with [place], or in a year for further refills.  Ellouise Newer, PA-C [facility] 03/22/2018, 11:03 AM  Cc: Grace Balloon, FNP

## 2018-03-25 NOTE — Progress Notes (Signed)
Reviewed and agree with initial management plan.  Siara Gorder T. Evalyne Cortopassi, MD FACG 

## 2018-04-17 ENCOUNTER — Encounter: Payer: Self-pay | Admitting: Family Medicine

## 2018-04-17 DIAGNOSIS — E782 Mixed hyperlipidemia: Secondary | ICD-10-CM | POA: Diagnosis not present

## 2018-04-17 DIAGNOSIS — E559 Vitamin D deficiency, unspecified: Secondary | ICD-10-CM | POA: Diagnosis not present

## 2018-04-17 DIAGNOSIS — Z008 Encounter for other general examination: Secondary | ICD-10-CM | POA: Diagnosis not present

## 2018-04-17 DIAGNOSIS — R7301 Impaired fasting glucose: Secondary | ICD-10-CM | POA: Diagnosis not present

## 2018-04-17 DIAGNOSIS — E669 Obesity, unspecified: Secondary | ICD-10-CM | POA: Diagnosis not present

## 2018-04-17 DIAGNOSIS — Z719 Counseling, unspecified: Secondary | ICD-10-CM | POA: Diagnosis not present

## 2018-05-01 DIAGNOSIS — R7301 Impaired fasting glucose: Secondary | ICD-10-CM | POA: Diagnosis not present

## 2018-05-01 DIAGNOSIS — E559 Vitamin D deficiency, unspecified: Secondary | ICD-10-CM | POA: Diagnosis not present

## 2018-05-01 DIAGNOSIS — E782 Mixed hyperlipidemia: Secondary | ICD-10-CM | POA: Diagnosis not present

## 2018-05-20 ENCOUNTER — Ambulatory Visit: Payer: BLUE CROSS/BLUE SHIELD | Admitting: Family Medicine

## 2018-05-20 ENCOUNTER — Encounter: Payer: Self-pay | Admitting: Family Medicine

## 2018-05-20 VITALS — BP 144/85 | HR 81 | Temp 97.1°F | Ht 62.5 in | Wt 197.0 lb

## 2018-05-20 DIAGNOSIS — E782 Mixed hyperlipidemia: Secondary | ICD-10-CM

## 2018-05-20 DIAGNOSIS — R0602 Shortness of breath: Secondary | ICD-10-CM | POA: Diagnosis not present

## 2018-05-20 DIAGNOSIS — R002 Palpitations: Secondary | ICD-10-CM | POA: Diagnosis not present

## 2018-05-20 DIAGNOSIS — Z8249 Family history of ischemic heart disease and other diseases of the circulatory system: Secondary | ICD-10-CM | POA: Diagnosis not present

## 2018-05-20 MED ORDER — PITAVASTATIN CALCIUM 2 MG PO TABS: 2.0000 mg | ORAL_TABLET | Freq: Every day | ORAL | 12 refills | Status: DC

## 2018-05-20 NOTE — Progress Notes (Addendum)
Subjective: CC: tachycardia, palpitations PCP: Sharion Balloon, FNP [id number] Grace Gomez is a 52 y.o. female presenting to clinic today for:  1. Palpitations/ fast heart rate Patient reports that she was seeing the PA at work when for ear pain when she was noted to have an elevated heart rate.  She reports that this was rechecked and continued to be 108 despite having rested.  She does report feeling like she was having heart palpitations during that time.  She reports shortness of breath that has been intermittent over the last couple of weeks.  She primarily feels as when she is exerting herself.  She notes associated lower extremity edema occasionally.  She denies any overt chest pain, dizziness, nausea, vomiting or diaphoresis.  She has a family history that is significant for early heart disease with a myocardial infarction in her mother at age 64, myocardial infarction at age 39 and her maternal aunt and her brother who has several cardiac stents placed at age 100.  She has a history of hyperlipidemia and has treated with cholesterol medication in the past but she notes that she was intolerant secondary to muscle cramping to Lipitor, Crestor and Pravachol.  She sees the PA at work every 3 months for check in on this.   ROS: Per HPI  Allergies  Allergen Reactions  . Sulfa Antibiotics Hives   Past Medical History:  Diagnosis Date  . GERD (gastroesophageal reflux disease)   . Hyperlipidemia   . Hypertension   . IBS (irritable bowel syndrome)    diarrhea    Current Outpatient Medications:  .  Cholecalciferol (VITAMIN D) 2000 units CAPS, Take by mouth., Disp: , Rfl:  .  dexlansoprazole (DEXILANT) 60 MG capsule, Take 1 capsule (60 mg total) by mouth daily., Disp: 30 capsule, Rfl: 11 .  Peppermint Oil (IBGARD PO), Take 1 capsule by mouth every morning. Take 2 caps in the morning 30 min before breakfast, take 2 caps with dinner., Disp: , Rfl:  Social History   Socioeconomic History  .  Marital status: Single    Spouse name: Not on file  . Number of children: 3  . Years of education: Not on file  . Highest education level: Not on file  Occupational History  . Occupation: Doctor, general practice  Social Needs  . Financial resource strain: Not on file  . Food insecurity:    Worry: Not on file    Inability: Not on file  . Transportation needs:    Medical: Not on file    Non-medical: Not on file  Tobacco Use  . Smoking status: Former Smoker    Packs/day: 1.00    Types: Cigarettes    Start date: 07/06/1980    Last attempt to quit: 08/20/2001    Years since quitting: 16.7  . Smokeless tobacco: Never Used  Substance and Sexual Activity  . Alcohol use: No  . Drug use: No  . Sexual activity: Not on file  Lifestyle  . Physical activity:    Days per week: Not on file    Minutes per session: Not on file  . Stress: Not on file  Relationships  . Social connections:    Talks on phone: Not on file    Gets together: Not on file    Attends religious service: Not on file    Active member of club or organization: Not on file    Attends meetings of clubs or organizations: Not on file    Relationship status: Not on  file  . Intimate partner violence:    Fear of current or ex partner: Not on file    Emotionally abused: Not on file    Physically abused: Not on file    Forced sexual activity: Not on file  Other Topics Concern  . Not on file  Social History Narrative  . Not on file   Family History  Problem Relation Age of Onset  . Heart disease Mother 75  . Hypertension Mother   . Cancer Father   . Heart disease Brother   . Heart disease Maternal Aunt 46  . Colon cancer Maternal Grandmother   . Crohn's disease Cousin   . Irritable bowel syndrome Daughter     Objective: Office vital signs reviewed. BP (!) 144/85   Pulse 81   Temp (!) 97.1 F (36.2 C) (Oral)   Ht 5' 2.5" (1.588 m)   Wt 197 lb (89.4 kg)   BMI 35.46 kg/m   Physical Examination:  General:  Awake, alert, well nourished, No acute distress HEENT: Normal    Neck: No masses palpated. No lymphadenopathy; no JVD. No carotid bruits.    Eyes: PERRLA, extraocular membranes intact, sclera [place]; no exophthalmos Cardio: regular rate and rhythm, S1S2 heard, no murmurs appreciated Pulm: clear to auscultation bilaterally, no wheezes, rhonchi or rales; normal work of breathing on room air Extremities: warm, well perfused, No edema, cyanosis or clubbing; +2 pulses bilaterally  Assessment/ Plan: 52 y.o. female   1. Heart palpitations Cardiovascular exam was unremarkable.  No audible arrhythmias.  EKG was obtained which demonstrated no acute abnormalities, including arrhythmia or evidence of ischemia.  I reviewed the labs that were obtained at the beginning of May by her work.  No evidence of renal, liver disease.  Her electrolytes were within normal limits.  No evidence of anemia.  She had a normal magnesium level and normal TSH.  Labs were not repeated today.  Given her intermittent symptoms of shortness of breath, obesity,hyperlipidemia, heart palpitations and family history of early cardiovascular disease/MI, I do think it pertinent for her to be evaluated with stress testing.  I placed a referral to cardiology. - EKG 12-Lead - Ambulatory referral to Cardiology  2. Family history of early CAD - Ambulatory referral to Cardiology  3. Mixed hyperlipidemia Total cholesterol 226, HDL 48, triglycerides 213 and LDL 143 on May 2019 lipid panel.  Livalo 2 mg nightly prescribed.  Coupon given to patient.  Follow-up w/ PA at work for interval Lipid in 3 months.   Orders Placed This Encounter  Procedures  . Ambulatory referral to Cardiology    Referral Priority:   Routine    Referral Type:   Consultation    Referral Reason:   Specialty Services Required    Requested Specialty:   Cardiology    Number of Visits Requested:   1  . EKG 12-Lead   Meds ordered this encounter  Medications  .  Pitavastatin Calcium (LIVALO) 2 MG TABS    Sig: Take 1 tablet (2 mg total) by mouth at bedtime.    Dispense:  30 tablet    Refill:  12    Intolerant (myalgia) to Lipitor, Crestor, Pravachol.     Janora Norlander, DO [place] [phone]

## 2018-05-20 NOTE — Patient Instructions (Signed)
Your EKG did not demonstrate any evidence of ischemia or abnormal heart rhythm.  As we discussed, I have placed a referral to the cardiologist in [facility] for risk stratification given your family history of early cardiovascular disease.   Palpitations A palpitation is the feeling that your heartbeat is irregular or is faster than normal. It may feel like your heart is fluttering or skipping a beat. Palpitations are usually not a serious problem. They may be caused by many things, including smoking, caffeine, alcohol, stress, and certain medicines. Although most causes of palpitations are not serious, palpitations can be a sign of a serious medical problem. In some cases, you may need further medical evaluation. Follow these instructions at home: Pay attention to any changes in your symptoms. Take these actions to help with your condition:  Avoid the following: ? Caffeinated coffee, tea, soft drinks, diet pills, and energy drinks. ? Chocolate. ? Alcohol.  Do not use any tobacco products, such as cigarettes, chewing tobacco, and e-cigarettes. If you need help quitting, ask your health care provider.  Try to reduce your stress and anxiety. Things that can help you relax include: ? Yoga. ? Meditation. ? Physical activity, such as swimming, jogging, or walking. ? Biofeedback. This is a method that helps you learn to use your mind to control things in your body, such as your heartbeats.  Get plenty of rest and sleep.  Take over-the-counter and prescription medicines only as told by your health care provider.  Keep all follow-up visits as told by your health care provider. This is important.  Contact a health care provider if:  You continue to have a fast or irregular heartbeat after 24 hours.  Your palpitations occur more often. Get help right away if:  You have chest pain or shortness of breath.  You have a severe headache.  You feel dizzy or you faint. This information is not intended  to replace advice given to you by your health care provider. Make sure you discuss any questions you have with your health care provider. Document Released: 11/24/2000 Document Revised: 05/01/2016 Document Reviewed: 08/12/2015 Elsevier Interactive Patient Education  [employer].

## 2018-06-17 DIAGNOSIS — E559 Vitamin D deficiency, unspecified: Secondary | ICD-10-CM | POA: Diagnosis not present

## 2018-06-17 DIAGNOSIS — L0231 Cutaneous abscess of buttock: Secondary | ICD-10-CM | POA: Diagnosis not present

## 2018-06-17 DIAGNOSIS — L03317 Cellulitis of buttock: Secondary | ICD-10-CM | POA: Diagnosis not present

## 2018-06-17 DIAGNOSIS — Z719 Counseling, unspecified: Secondary | ICD-10-CM | POA: Diagnosis not present

## 2018-06-17 DIAGNOSIS — E782 Mixed hyperlipidemia: Secondary | ICD-10-CM | POA: Diagnosis not present

## 2018-06-17 DIAGNOSIS — Z008 Encounter for other general examination: Secondary | ICD-10-CM | POA: Diagnosis not present

## 2018-06-19 DIAGNOSIS — L03317 Cellulitis of buttock: Secondary | ICD-10-CM | POA: Diagnosis not present

## 2018-06-20 ENCOUNTER — Ambulatory Visit: Payer: Self-pay | Admitting: Cardiology

## 2018-06-26 DIAGNOSIS — L03317 Cellulitis of buttock: Secondary | ICD-10-CM | POA: Diagnosis not present

## 2018-07-10 DIAGNOSIS — Z7689 Persons encountering health services in other specified circumstances: Secondary | ICD-10-CM | POA: Diagnosis not present

## 2018-07-10 DIAGNOSIS — E669 Obesity, unspecified: Secondary | ICD-10-CM | POA: Diagnosis not present

## 2018-07-10 DIAGNOSIS — Z6833 Body mass index (BMI) 33.0-33.9, adult: Secondary | ICD-10-CM | POA: Diagnosis not present

## 2018-07-24 DIAGNOSIS — E669 Obesity, unspecified: Secondary | ICD-10-CM | POA: Diagnosis not present

## 2018-07-24 DIAGNOSIS — Z7689 Persons encountering health services in other specified circumstances: Secondary | ICD-10-CM | POA: Diagnosis not present

## 2018-07-24 DIAGNOSIS — Z6833 Body mass index (BMI) 33.0-33.9, adult: Secondary | ICD-10-CM | POA: Diagnosis not present

## 2018-07-24 DIAGNOSIS — F4320 Adjustment disorder, unspecified: Secondary | ICD-10-CM | POA: Diagnosis not present

## 2018-08-02 ENCOUNTER — Ambulatory Visit: Payer: BLUE CROSS/BLUE SHIELD | Admitting: Nurse Practitioner

## 2018-08-02 ENCOUNTER — Encounter: Payer: Self-pay | Admitting: Nurse Practitioner

## 2018-08-02 VITALS — BP 135/87 | HR 93 | Temp 98.2°F | Ht 62.5 in | Wt 194.0 lb

## 2018-08-02 DIAGNOSIS — R300 Dysuria: Secondary | ICD-10-CM

## 2018-08-02 DIAGNOSIS — M542 Cervicalgia: Secondary | ICD-10-CM

## 2018-08-02 LAB — URINALYSIS
Bilirubin, UA: NEGATIVE
Glucose, UA: NEGATIVE
Ketones, UA: NEGATIVE
LEUKOCYTES UA: NEGATIVE
Nitrite, UA: NEGATIVE
Urobilinogen, Ur: 0.2 mg/dL (ref 0.2–1.0)
pH, UA: 5.5 (ref 5.0–7.5)

## 2018-08-02 MED ORDER — DICLOFENAC SODIUM 1 % TD GEL: 2.0000 g | Freq: Four times a day (QID) | TRANSDERMAL | 1 refills | Status: DC

## 2018-08-02 NOTE — Progress Notes (Addendum)
   Subjective:    Patient ID: Grace Gomez, female    DOB: Apr 10, 1966, 52 y.o.   MRN: [id number]   Chief Complaint: Urinary Tract Infection and Shoulder Pain   HPI - ? UTI- urinary urgency and frequency that started yesterday. deneis any burning. - shoulder/neck pain- started a couple of months ago. Started out as intermittent and now is  constant. Both hands are getting numb and tingling. Mainly on right side. Has taken tylenol which eases pain some. Cannot take NSAIDS because of her stomach. Rates pain 7/10 currenlty. Nothing makes worse or better.   Review of Systems  Constitutional: Negative.   HENT: Negative.   Respiratory: Negative.   Cardiovascular: Negative.   Gastrointestinal: Negative for abdominal pain and nausea.  Genitourinary: Positive for frequency and urgency. Negative for dysuria.  Musculoskeletal: Positive for arthralgias (right neck and shoulder).  Neurological: Negative.   Psychiatric/Behavioral: Negative.   All other systems reviewed and are negative.      Objective:   Physical Exam  Constitutional: She is oriented to person, place, and time. She appears well-developed and well-nourished. No distress.  Cardiovascular: Normal rate.  Pulmonary/Chest: Effort normal.  Musculoskeletal:  Decrease ROM of cervical spine with pain on right side with rotation to right and flexion to right. FROM of right shoulder without pain Grips equal bil Motor strength and sensation distally intact   Neurological: She is alert and oriented to person, place, and time. No cranial nerve deficit.  Skin: Skin is warm and dry.   BP 135/87   Pulse 93   Temp 98.2 F (36.8 C) (Oral)   Ht 5' 2.5" (1.588 m)   Wt 194 lb (88 kg)   BMI 34.92 kg/m     urine dip clear- will send for culture     Assessment & Plan:  Grace Gomez in today with chief complaint of Urinary Tract Infection and Shoulder Pain   1. Dysuria Force fluids- cranberry juice Will wait on culture results -  Urinalysis - Urine Culture  2. Neck pain on right side Ice BID Rest RTO prn or if not improving - diclofenac sodium (VOLTAREN) 1 % GEL; Apply 2 g topically 4 (four) times daily.  Dispense: 5 Tube; Refill: [place], FNP    08/03/18- patient called and said she was still symptomatic of uti despite urine clear yesterday. Urine cultur not back yet but cipro was sent to pharmacy, cipro 500mg  1 po BID #10 no refills

## 2018-08-02 NOTE — Patient Instructions (Signed)

## 2018-08-03 ENCOUNTER — Telehealth: Payer: Self-pay | Admitting: Nurse Practitioner

## 2018-08-03 MED ORDER — CIPROFLOXACIN HCL 500 MG PO TABS: 500.0000 mg | ORAL_TABLET | Freq: Two times a day (BID) | ORAL | 0 refills | Status: DC

## 2018-08-03 NOTE — Addendum Note (Signed)
Addended by: Chevis Pretty on: 08/03/2018 09:53 AM   Modules accepted: Orders

## 2018-08-03 NOTE — Telephone Encounter (Signed)
ABT called in, pt aware

## 2018-08-04 LAB — URINE CULTURE

## 2018-08-05 ENCOUNTER — Telehealth: Payer: Self-pay | Admitting: Family

## 2018-08-06 NOTE — Telephone Encounter (Signed)
Aware of results. 

## 2018-08-07 DIAGNOSIS — M542 Cervicalgia: Secondary | ICD-10-CM | POA: Diagnosis not present

## 2018-08-07 DIAGNOSIS — G5603 Carpal tunnel syndrome, bilateral upper limbs: Secondary | ICD-10-CM | POA: Diagnosis not present

## 2018-08-07 DIAGNOSIS — M25511 Pain in right shoulder: Secondary | ICD-10-CM | POA: Diagnosis not present

## 2018-08-21 DIAGNOSIS — Z139 Encounter for screening, unspecified: Secondary | ICD-10-CM | POA: Diagnosis not present

## 2018-08-21 DIAGNOSIS — E559 Vitamin D deficiency, unspecified: Secondary | ICD-10-CM | POA: Diagnosis not present

## 2018-08-21 DIAGNOSIS — Z79899 Other long term (current) drug therapy: Secondary | ICD-10-CM | POA: Diagnosis not present

## 2018-08-21 DIAGNOSIS — R7301 Impaired fasting glucose: Secondary | ICD-10-CM | POA: Diagnosis not present

## 2018-08-21 DIAGNOSIS — Z0130 Encounter for examination of blood pressure without abnormal findings: Secondary | ICD-10-CM | POA: Diagnosis not present

## 2018-08-21 DIAGNOSIS — E782 Mixed hyperlipidemia: Secondary | ICD-10-CM | POA: Diagnosis not present

## 2018-09-05 DIAGNOSIS — Z23 Encounter for immunization: Secondary | ICD-10-CM | POA: Diagnosis not present

## 2018-09-09 DIAGNOSIS — R7982 Elevated C-reactive protein (CRP): Secondary | ICD-10-CM | POA: Diagnosis not present

## 2018-09-09 DIAGNOSIS — E782 Mixed hyperlipidemia: Secondary | ICD-10-CM | POA: Diagnosis not present

## 2018-09-09 DIAGNOSIS — R7301 Impaired fasting glucose: Secondary | ICD-10-CM | POA: Diagnosis not present

## 2018-09-09 DIAGNOSIS — E559 Vitamin D deficiency, unspecified: Secondary | ICD-10-CM | POA: Diagnosis not present

## 2018-09-18 DIAGNOSIS — E669 Obesity, unspecified: Secondary | ICD-10-CM | POA: Diagnosis not present

## 2018-09-18 DIAGNOSIS — Z008 Encounter for other general examination: Secondary | ICD-10-CM | POA: Diagnosis not present

## 2018-09-18 DIAGNOSIS — E782 Mixed hyperlipidemia: Secondary | ICD-10-CM | POA: Diagnosis not present

## 2018-09-18 DIAGNOSIS — E559 Vitamin D deficiency, unspecified: Secondary | ICD-10-CM | POA: Diagnosis not present

## 2018-09-23 DIAGNOSIS — J302 Other seasonal allergic rhinitis: Secondary | ICD-10-CM | POA: Diagnosis not present

## 2018-09-27 DIAGNOSIS — Q179 Congenital malformation of ear, unspecified: Secondary | ICD-10-CM | POA: Diagnosis not present

## 2018-10-02 DIAGNOSIS — Z7689 Persons encountering health services in other specified circumstances: Secondary | ICD-10-CM | POA: Diagnosis not present

## 2018-10-02 DIAGNOSIS — E669 Obesity, unspecified: Secondary | ICD-10-CM | POA: Diagnosis not present

## 2018-10-02 DIAGNOSIS — Z6833 Body mass index (BMI) 33.0-33.9, adult: Secondary | ICD-10-CM | POA: Diagnosis not present

## 2018-11-03 DIAGNOSIS — R05 Cough: Secondary | ICD-10-CM | POA: Diagnosis not present

## 2018-11-03 DIAGNOSIS — B349 Viral infection, unspecified: Secondary | ICD-10-CM | POA: Diagnosis not present

## 2018-11-03 DIAGNOSIS — Z79899 Other long term (current) drug therapy: Secondary | ICD-10-CM | POA: Diagnosis not present

## 2018-11-03 DIAGNOSIS — Z87891 Personal history of nicotine dependence: Secondary | ICD-10-CM | POA: Diagnosis not present

## 2018-11-03 DIAGNOSIS — K219 Gastro-esophageal reflux disease without esophagitis: Secondary | ICD-10-CM | POA: Diagnosis not present

## 2018-11-04 DIAGNOSIS — Z6833 Body mass index (BMI) 33.0-33.9, adult: Secondary | ICD-10-CM | POA: Diagnosis not present

## 2018-11-04 DIAGNOSIS — Z7689 Persons encountering health services in other specified circumstances: Secondary | ICD-10-CM | POA: Diagnosis not present

## 2018-11-04 DIAGNOSIS — J209 Acute bronchitis, unspecified: Secondary | ICD-10-CM | POA: Diagnosis not present

## 2018-11-04 DIAGNOSIS — E669 Obesity, unspecified: Secondary | ICD-10-CM | POA: Diagnosis not present

## 2018-11-07 DIAGNOSIS — N3001 Acute cystitis with hematuria: Secondary | ICD-10-CM | POA: Diagnosis not present

## 2018-11-07 DIAGNOSIS — R300 Dysuria: Secondary | ICD-10-CM | POA: Diagnosis not present

## 2018-11-07 DIAGNOSIS — Z87891 Personal history of nicotine dependence: Secondary | ICD-10-CM | POA: Diagnosis not present

## 2018-11-07 DIAGNOSIS — R350 Frequency of micturition: Secondary | ICD-10-CM | POA: Diagnosis not present

## 2018-11-27 DIAGNOSIS — Z6833 Body mass index (BMI) 33.0-33.9, adult: Secondary | ICD-10-CM | POA: Diagnosis not present

## 2018-11-27 DIAGNOSIS — E669 Obesity, unspecified: Secondary | ICD-10-CM | POA: Diagnosis not present

## 2018-11-27 DIAGNOSIS — Z7689 Persons encountering health services in other specified circumstances: Secondary | ICD-10-CM | POA: Diagnosis not present

## 2018-12-11 HISTORY — PX: BREAST EXCISIONAL BIOPSY: SUR124

## 2018-12-11 HISTORY — PX: BREAST BIOPSY: SHX20

## 2019-01-08 DIAGNOSIS — R7301 Impaired fasting glucose: Secondary | ICD-10-CM | POA: Diagnosis not present

## 2019-01-08 DIAGNOSIS — E559 Vitamin D deficiency, unspecified: Secondary | ICD-10-CM | POA: Diagnosis not present

## 2019-01-08 DIAGNOSIS — Z6833 Body mass index (BMI) 33.0-33.9, adult: Secondary | ICD-10-CM | POA: Diagnosis not present

## 2019-01-08 DIAGNOSIS — E782 Mixed hyperlipidemia: Secondary | ICD-10-CM | POA: Diagnosis not present

## 2019-01-27 DIAGNOSIS — J4520 Mild intermittent asthma, uncomplicated: Secondary | ICD-10-CM | POA: Diagnosis not present

## 2019-01-27 DIAGNOSIS — J0100 Acute maxillary sinusitis, unspecified: Secondary | ICD-10-CM | POA: Diagnosis not present

## 2019-02-10 DIAGNOSIS — R7301 Impaired fasting glucose: Secondary | ICD-10-CM | POA: Diagnosis not present

## 2019-02-10 DIAGNOSIS — E782 Mixed hyperlipidemia: Secondary | ICD-10-CM | POA: Diagnosis not present

## 2019-02-10 DIAGNOSIS — Z139 Encounter for screening, unspecified: Secondary | ICD-10-CM | POA: Diagnosis not present

## 2019-02-10 DIAGNOSIS — E559 Vitamin D deficiency, unspecified: Secondary | ICD-10-CM | POA: Diagnosis not present

## 2019-02-10 DIAGNOSIS — Z0130 Encounter for examination of blood pressure without abnormal findings: Secondary | ICD-10-CM | POA: Diagnosis not present

## 2019-02-24 DIAGNOSIS — R7982 Elevated C-reactive protein (CRP): Secondary | ICD-10-CM | POA: Diagnosis not present

## 2019-02-24 DIAGNOSIS — E559 Vitamin D deficiency, unspecified: Secondary | ICD-10-CM | POA: Diagnosis not present

## 2019-02-24 DIAGNOSIS — R7301 Impaired fasting glucose: Secondary | ICD-10-CM | POA: Diagnosis not present

## 2019-02-24 DIAGNOSIS — E782 Mixed hyperlipidemia: Secondary | ICD-10-CM | POA: Diagnosis not present

## 2019-02-26 ENCOUNTER — Ambulatory Visit: Payer: BLUE CROSS/BLUE SHIELD | Admitting: Allergy & Immunology

## 2019-03-24 DIAGNOSIS — L309 Dermatitis, unspecified: Secondary | ICD-10-CM | POA: Diagnosis not present

## 2019-03-24 DIAGNOSIS — E669 Obesity, unspecified: Secondary | ICD-10-CM | POA: Diagnosis not present

## 2019-03-24 DIAGNOSIS — Z7689 Persons encountering health services in other specified circumstances: Secondary | ICD-10-CM | POA: Diagnosis not present

## 2019-04-18 ENCOUNTER — Ambulatory Visit (INDEPENDENT_AMBULATORY_CARE_PROVIDER_SITE_OTHER): Payer: BLUE CROSS/BLUE SHIELD | Admitting: Family

## 2019-04-18 ENCOUNTER — Other Ambulatory Visit: Payer: Self-pay

## 2019-04-18 ENCOUNTER — Encounter: Payer: Self-pay | Admitting: Family

## 2019-04-18 ENCOUNTER — Other Ambulatory Visit: Payer: Self-pay | Admitting: Physician Assistant

## 2019-04-18 DIAGNOSIS — K580 Irritable bowel syndrome with diarrhea: Secondary | ICD-10-CM

## 2019-04-18 DIAGNOSIS — E782 Mixed hyperlipidemia: Secondary | ICD-10-CM | POA: Diagnosis not present

## 2019-04-18 DIAGNOSIS — K219 Gastro-esophageal reflux disease without esophagitis: Secondary | ICD-10-CM

## 2019-04-18 DIAGNOSIS — K589 Irritable bowel syndrome without diarrhea: Secondary | ICD-10-CM | POA: Insufficient documentation

## 2019-04-18 MED ORDER — DEXLANSOPRAZOLE 60 MG PO CPDR: 60.0000 mg | DELAYED_RELEASE_CAPSULE | Freq: Every day | ORAL | 4 refills | Status: DC

## 2019-04-18 NOTE — Progress Notes (Signed)
Virtual Visit via telephone Note  I connected with Grace Gomez on 04/18/19 at 1:41 pm by telephone and verified that I am speaking with the correct person using two identifiers. Grace Gomez is currently located at home and no one is currently with her during visit. The provider, Evelina Dun, FNP is located in their office at time of visit.  I discussed the limitations, risks, security and privacy concerns of performing an evaluation and management service by telephone and the availability of in person appointments. I also discussed with the patient that there may be a patient responsible charge related to this service. The patient expressed understanding and agreed to proceed.   History and Present Illness:  Pt calls the office today for chronic follow up.  Gastroesophageal Reflux  She reports no belching, no coughing or no heartburn. This is a chronic problem. The current episode started more than 1 year ago. The problem occurs occasionally. The problem has been waxing and waning. The symptoms are aggravated by lying down and certain foods. Risk factors include obesity. She has tried a PPI for the symptoms. The treatment provided moderate relief.  Hyperlipidemia  This is a chronic problem. The current episode started more than 1 year ago. The problem is uncontrolled. Exacerbating diseases include obesity. Current antihyperlipidemic treatment includes diet change. The current treatment provides moderate improvement of lipids. Risk factors for coronary artery disease include dyslipidemia.  Diarrhea   This is a chronic problem. The current episode started more than 1 year ago. The problem occurs 2 to 4 times per day. Associated symptoms include bloating and increased flatus. Pertinent negatives include no coughing. She has tried anti-motility drug for the symptoms. The treatment provided mild relief.      Review of Systems  Respiratory: Negative for cough.   Gastrointestinal: Positive for  bloating, diarrhea and flatus. Negative for heartburn.  All other systems reviewed and are negative.    Observations/Objective: No SOB or distress  Assessment and Plan: [name] comes in today with chief complaint of No chief complaint on file.   Diagnosis and orders addressed:  1. Gastroesophageal reflux disease, esophagitis presence not specified -Diet discussed- Avoid fried, spicy, citrus foods, caffeine and alcohol -Do not eat 2-3 hours before bedtime -Encouraged small frequent meals -Avoid NSAID's - dexlansoprazole (DEXILANT) 60 MG capsule; Take 1 capsule (60 mg total) by mouth daily.  Dispense: 90 capsule; Refill: 4  2. Mixed hyperlipidemia Low fat diet and exercise  3. Irritable bowel syndrome with diarrhea Start daily fiber Stress management  Continue IBGard   Health Maintenance reviewed Diet and exercise encouraged  Follow up plan: 4 months for lab work      I discussed the assessment and treatment plan with the patient. The patient was provided an opportunity to ask questions and all were answered. The patient agreed with the plan and demonstrated an understanding of the instructions.   The patient was advised to call back or seek an in-person evaluation if the symptoms worsen or if the condition fails to improve as anticipated.  The above assessment and management plan was discussed with the patient. The patient verbalized understanding of and has agreed to the management plan. Patient is aware to call the clinic if symptoms persist or worsen. Patient is aware when to return to the clinic for a follow-up visit. Patient educated on when it is appropriate to go to the emergency department.   Time call ended:  1:55 pm  I provided 14 minutes of non-face-to-face  time during this encounter.    Evelina Dun, FNP

## 2019-05-21 ENCOUNTER — Ambulatory Visit: Payer: Self-pay | Admitting: Allergy & Immunology

## 2019-06-02 DIAGNOSIS — E782 Mixed hyperlipidemia: Secondary | ICD-10-CM | POA: Diagnosis not present

## 2019-06-02 DIAGNOSIS — R7301 Impaired fasting glucose: Secondary | ICD-10-CM | POA: Diagnosis not present

## 2019-06-02 DIAGNOSIS — R7982 Elevated C-reactive protein (CRP): Secondary | ICD-10-CM | POA: Diagnosis not present

## 2019-06-02 DIAGNOSIS — E559 Vitamin D deficiency, unspecified: Secondary | ICD-10-CM | POA: Diagnosis not present

## 2019-06-16 DIAGNOSIS — J302 Other seasonal allergic rhinitis: Secondary | ICD-10-CM | POA: Diagnosis not present

## 2019-06-16 DIAGNOSIS — Q179 Congenital malformation of ear, unspecified: Secondary | ICD-10-CM | POA: Diagnosis not present

## 2019-08-14 ENCOUNTER — Ambulatory Visit: Payer: BLUE CROSS/BLUE SHIELD | Admitting: Family

## 2019-08-14 ENCOUNTER — Encounter: Payer: BC Managed Care – PPO | Admitting: Family

## 2019-08-25 DIAGNOSIS — Z6834 Body mass index (BMI) 34.0-34.9, adult: Secondary | ICD-10-CM | POA: Diagnosis not present

## 2019-08-25 DIAGNOSIS — E669 Obesity, unspecified: Secondary | ICD-10-CM | POA: Diagnosis not present

## 2019-08-25 DIAGNOSIS — Z719 Counseling, unspecified: Secondary | ICD-10-CM | POA: Diagnosis not present

## 2019-08-27 DIAGNOSIS — Z1231 Encounter for screening mammogram for malignant neoplasm of breast: Secondary | ICD-10-CM | POA: Diagnosis not present

## 2019-08-29 ENCOUNTER — Telehealth: Payer: Self-pay | Admitting: Family

## 2019-08-29 NOTE — Telephone Encounter (Signed)
Patient aware of mammogram results.  

## 2019-09-01 ENCOUNTER — Telehealth: Payer: Self-pay

## 2019-09-01 NOTE — Telephone Encounter (Signed)
Called patient to notify of needed additional right breast imaging per mammo done on 08/27/2019.  Patient states that [facility] has called and she has appointment on 09/09/2019 in [place]. MPulliam, CMA/RT(R)

## 2019-09-03 ENCOUNTER — Other Ambulatory Visit: Payer: Self-pay

## 2019-09-03 DIAGNOSIS — R928 Other abnormal and inconclusive findings on diagnostic imaging of breast: Secondary | ICD-10-CM

## 2019-09-03 NOTE — Progress Notes (Signed)
Placed orders for additional views and US of the right breast due to incomplete/abnormal screening mammogram.  Patient has appointe with [facility] at the [facility] for 09/09/2019. Printed and faxed signed orders to their office.  Patient is aware of this appointment and location. MPulliam, CMA/RT(R)

## 2019-09-09 DIAGNOSIS — R928 Other abnormal and inconclusive findings on diagnostic imaging of breast: Secondary | ICD-10-CM | POA: Diagnosis not present

## 2019-09-10 ENCOUNTER — Telehealth: Payer: Self-pay | Admitting: Family

## 2019-09-10 NOTE — Telephone Encounter (Signed)
Called and spoke to patient in regards to her breast biopsy.  Verified with patient that appointment has already been set up for 09/11/2019 at [facility].  Patient will follow up with any questions and was advised by [facility] that she will get results early next week. MPulliam, CMA/RT(R)

## 2019-09-11 DIAGNOSIS — R928 Other abnormal and inconclusive findings on diagnostic imaging of breast: Secondary | ICD-10-CM | POA: Diagnosis not present

## 2019-09-11 DIAGNOSIS — C50411 Malignant neoplasm of upper-outer quadrant of right female breast: Secondary | ICD-10-CM | POA: Diagnosis not present

## 2019-09-11 DIAGNOSIS — N63 Unspecified lump in unspecified breast: Secondary | ICD-10-CM | POA: Diagnosis not present

## 2019-09-11 DIAGNOSIS — Z170 Estrogen receptor positive status [ER+]: Secondary | ICD-10-CM | POA: Diagnosis not present

## 2019-09-18 ENCOUNTER — Telehealth: Payer: Self-pay

## 2019-09-18 NOTE — Telephone Encounter (Signed)
Called patient to verify that [facility] has discussed biopsy results and if appointment has been made for surgical consult.  Unable to reach the patient left message for patient to call back. MPulliam, CMA/RT(R)

## 2019-09-22 ENCOUNTER — Encounter: Payer: BC Managed Care – PPO | Admitting: Family

## 2019-09-22 DIAGNOSIS — L309 Dermatitis, unspecified: Secondary | ICD-10-CM | POA: Diagnosis not present

## 2019-09-22 DIAGNOSIS — C50911 Malignant neoplasm of unspecified site of right female breast: Secondary | ICD-10-CM | POA: Diagnosis not present

## 2019-09-23 DIAGNOSIS — C50911 Malignant neoplasm of unspecified site of right female breast: Secondary | ICD-10-CM | POA: Insufficient documentation

## 2019-10-01 DIAGNOSIS — Z01818 Encounter for other preprocedural examination: Secondary | ICD-10-CM | POA: Diagnosis not present

## 2019-10-01 DIAGNOSIS — Z719 Counseling, unspecified: Secondary | ICD-10-CM | POA: Diagnosis not present

## 2019-10-01 DIAGNOSIS — C50919 Malignant neoplasm of unspecified site of unspecified female breast: Secondary | ICD-10-CM | POA: Diagnosis not present

## 2019-10-01 DIAGNOSIS — E559 Vitamin D deficiency, unspecified: Secondary | ICD-10-CM | POA: Diagnosis not present

## 2019-10-01 DIAGNOSIS — Z01810 Encounter for preprocedural cardiovascular examination: Secondary | ICD-10-CM | POA: Diagnosis not present

## 2019-10-01 DIAGNOSIS — E785 Hyperlipidemia, unspecified: Secondary | ICD-10-CM | POA: Diagnosis not present

## 2019-10-01 DIAGNOSIS — I10 Essential (primary) hypertension: Secondary | ICD-10-CM | POA: Diagnosis not present

## 2019-10-01 DIAGNOSIS — N951 Menopausal and female climacteric states: Secondary | ICD-10-CM | POA: Diagnosis not present

## 2019-10-01 DIAGNOSIS — Z170 Estrogen receptor positive status [ER+]: Secondary | ICD-10-CM | POA: Diagnosis not present

## 2019-10-01 DIAGNOSIS — C50911 Malignant neoplasm of unspecified site of right female breast: Secondary | ICD-10-CM | POA: Diagnosis not present

## 2019-10-01 DIAGNOSIS — Z79899 Other long term (current) drug therapy: Secondary | ICD-10-CM | POA: Diagnosis not present

## 2019-10-01 DIAGNOSIS — K219 Gastro-esophageal reflux disease without esophagitis: Secondary | ICD-10-CM | POA: Diagnosis not present

## 2019-10-01 DIAGNOSIS — Z6834 Body mass index (BMI) 34.0-34.9, adult: Secondary | ICD-10-CM | POA: Diagnosis not present

## 2019-10-01 DIAGNOSIS — E669 Obesity, unspecified: Secondary | ICD-10-CM | POA: Diagnosis not present

## 2019-10-01 DIAGNOSIS — Z87891 Personal history of nicotine dependence: Secondary | ICD-10-CM | POA: Diagnosis not present

## 2019-10-07 ENCOUNTER — Telehealth: Payer: Self-pay

## 2019-10-07 ENCOUNTER — Telehealth: Payer: Self-pay | Admitting: Family

## 2019-10-07 NOTE — Telephone Encounter (Signed)
Called patient to follow up on recommendation of surgical consult per [facility] at [place].  Left message for patient to call back. MPulliam, CMA/RT(R)

## 2019-10-08 NOTE — Telephone Encounter (Signed)
Called and spoke to patient - she has appointment for surgery on 10/14/2019 and will follow up with our office as needed. MPulliam, CMA/RT(R)

## 2019-10-11 DIAGNOSIS — Z01818 Encounter for other preprocedural examination: Secondary | ICD-10-CM | POA: Diagnosis not present

## 2019-10-14 DIAGNOSIS — R112 Nausea with vomiting, unspecified: Secondary | ICD-10-CM | POA: Diagnosis not present

## 2019-10-14 DIAGNOSIS — Z87891 Personal history of nicotine dependence: Secondary | ICD-10-CM | POA: Diagnosis not present

## 2019-10-14 DIAGNOSIS — K219 Gastro-esophageal reflux disease without esophagitis: Secondary | ICD-10-CM | POA: Diagnosis not present

## 2019-10-14 DIAGNOSIS — C50411 Malignant neoplasm of upper-outer quadrant of right female breast: Secondary | ICD-10-CM | POA: Diagnosis not present

## 2019-10-14 DIAGNOSIS — C50911 Malignant neoplasm of unspecified site of right female breast: Secondary | ICD-10-CM | POA: Diagnosis not present

## 2019-10-14 DIAGNOSIS — Z801 Family history of malignant neoplasm of trachea, bronchus and lung: Secondary | ICD-10-CM | POA: Diagnosis not present

## 2019-10-14 DIAGNOSIS — Z170 Estrogen receptor positive status [ER+]: Secondary | ICD-10-CM | POA: Diagnosis not present

## 2019-10-14 DIAGNOSIS — K580 Irritable bowel syndrome with diarrhea: Secondary | ICD-10-CM | POA: Diagnosis not present

## 2019-10-14 DIAGNOSIS — Z8041 Family history of malignant neoplasm of ovary: Secondary | ICD-10-CM | POA: Diagnosis not present

## 2019-10-14 DIAGNOSIS — Z882 Allergy status to sulfonamides status: Secondary | ICD-10-CM | POA: Diagnosis not present

## 2019-10-14 DIAGNOSIS — Z780 Asymptomatic menopausal state: Secondary | ICD-10-CM | POA: Diagnosis not present

## 2019-10-14 DIAGNOSIS — E669 Obesity, unspecified: Secondary | ICD-10-CM | POA: Diagnosis not present

## 2019-10-14 DIAGNOSIS — Z79899 Other long term (current) drug therapy: Secondary | ICD-10-CM | POA: Diagnosis not present

## 2019-10-14 DIAGNOSIS — Z6834 Body mass index (BMI) 34.0-34.9, adult: Secondary | ICD-10-CM | POA: Diagnosis not present

## 2019-10-15 DIAGNOSIS — K580 Irritable bowel syndrome with diarrhea: Secondary | ICD-10-CM | POA: Diagnosis not present

## 2019-10-15 DIAGNOSIS — Z170 Estrogen receptor positive status [ER+]: Secondary | ICD-10-CM | POA: Diagnosis not present

## 2019-10-15 DIAGNOSIS — Z882 Allergy status to sulfonamides status: Secondary | ICD-10-CM | POA: Diagnosis not present

## 2019-10-15 DIAGNOSIS — Z87891 Personal history of nicotine dependence: Secondary | ICD-10-CM | POA: Diagnosis not present

## 2019-10-15 DIAGNOSIS — Z8041 Family history of malignant neoplasm of ovary: Secondary | ICD-10-CM | POA: Diagnosis not present

## 2019-10-15 DIAGNOSIS — C50411 Malignant neoplasm of upper-outer quadrant of right female breast: Secondary | ICD-10-CM | POA: Diagnosis not present

## 2019-10-15 DIAGNOSIS — R112 Nausea with vomiting, unspecified: Secondary | ICD-10-CM | POA: Diagnosis not present

## 2019-10-15 DIAGNOSIS — Z79899 Other long term (current) drug therapy: Secondary | ICD-10-CM | POA: Diagnosis not present

## 2019-10-15 DIAGNOSIS — Z6834 Body mass index (BMI) 34.0-34.9, adult: Secondary | ICD-10-CM | POA: Diagnosis not present

## 2019-10-15 DIAGNOSIS — Z801 Family history of malignant neoplasm of trachea, bronchus and lung: Secondary | ICD-10-CM | POA: Diagnosis not present

## 2019-10-15 DIAGNOSIS — K219 Gastro-esophageal reflux disease without esophagitis: Secondary | ICD-10-CM | POA: Diagnosis not present

## 2019-10-15 DIAGNOSIS — Z780 Asymptomatic menopausal state: Secondary | ICD-10-CM | POA: Diagnosis not present

## 2019-10-15 DIAGNOSIS — E669 Obesity, unspecified: Secondary | ICD-10-CM | POA: Diagnosis not present

## 2019-10-24 DIAGNOSIS — C50411 Malignant neoplasm of upper-outer quadrant of right female breast: Secondary | ICD-10-CM | POA: Diagnosis not present

## 2019-10-24 DIAGNOSIS — K589 Irritable bowel syndrome without diarrhea: Secondary | ICD-10-CM | POA: Diagnosis not present

## 2019-10-24 DIAGNOSIS — I10 Essential (primary) hypertension: Secondary | ICD-10-CM | POA: Diagnosis not present

## 2019-10-24 DIAGNOSIS — E559 Vitamin D deficiency, unspecified: Secondary | ICD-10-CM | POA: Diagnosis not present

## 2019-10-24 DIAGNOSIS — Z170 Estrogen receptor positive status [ER+]: Secondary | ICD-10-CM | POA: Diagnosis not present

## 2019-10-24 DIAGNOSIS — Z6834 Body mass index (BMI) 34.0-34.9, adult: Secondary | ICD-10-CM | POA: Diagnosis not present

## 2019-11-11 DIAGNOSIS — Z170 Estrogen receptor positive status [ER+]: Secondary | ICD-10-CM | POA: Diagnosis not present

## 2019-11-11 DIAGNOSIS — C50411 Malignant neoplasm of upper-outer quadrant of right female breast: Secondary | ICD-10-CM | POA: Diagnosis not present

## 2019-11-11 DIAGNOSIS — Z510 Encounter for antineoplastic radiation therapy: Secondary | ICD-10-CM | POA: Diagnosis not present

## 2019-11-12 DIAGNOSIS — C50411 Malignant neoplasm of upper-outer quadrant of right female breast: Secondary | ICD-10-CM | POA: Diagnosis not present

## 2019-11-12 DIAGNOSIS — Z170 Estrogen receptor positive status [ER+]: Secondary | ICD-10-CM | POA: Insufficient documentation

## 2019-11-12 DIAGNOSIS — Z510 Encounter for antineoplastic radiation therapy: Secondary | ICD-10-CM | POA: Diagnosis not present

## 2019-11-13 ENCOUNTER — Other Ambulatory Visit: Payer: Self-pay

## 2019-11-14 ENCOUNTER — Ambulatory Visit (INDEPENDENT_AMBULATORY_CARE_PROVIDER_SITE_OTHER): Payer: BC Managed Care – PPO | Admitting: Family

## 2019-11-14 ENCOUNTER — Encounter: Payer: Self-pay | Admitting: Family

## 2019-11-14 ENCOUNTER — Other Ambulatory Visit: Payer: Self-pay

## 2019-11-14 VITALS — BP 142/80 | HR 85 | Temp 97.8°F | Ht 62.5 in | Wt 197.6 lb

## 2019-11-14 DIAGNOSIS — Z01411 Encounter for gynecological examination (general) (routine) with abnormal findings: Secondary | ICD-10-CM | POA: Diagnosis not present

## 2019-11-14 DIAGNOSIS — I10 Essential (primary) hypertension: Secondary | ICD-10-CM

## 2019-11-14 DIAGNOSIS — Z01419 Encounter for gynecological examination (general) (routine) without abnormal findings: Secondary | ICD-10-CM

## 2019-11-14 DIAGNOSIS — E782 Mixed hyperlipidemia: Secondary | ICD-10-CM

## 2019-11-14 DIAGNOSIS — Z0000 Encounter for general adult medical examination without abnormal findings: Secondary | ICD-10-CM

## 2019-11-14 DIAGNOSIS — K219 Gastro-esophageal reflux disease without esophagitis: Secondary | ICD-10-CM | POA: Diagnosis not present

## 2019-11-14 DIAGNOSIS — E559 Vitamin D deficiency, unspecified: Secondary | ICD-10-CM

## 2019-11-14 DIAGNOSIS — K580 Irritable bowel syndrome with diarrhea: Secondary | ICD-10-CM

## 2019-11-14 DIAGNOSIS — Z0001 Encounter for general adult medical examination with abnormal findings: Secondary | ICD-10-CM | POA: Diagnosis not present

## 2019-11-14 LAB — URINALYSIS, COMPLETE
Bilirubin, UA: NEGATIVE
Glucose, UA: NEGATIVE
Ketones, UA: NEGATIVE
Nitrite, UA: NEGATIVE
Protein,UA: NEGATIVE
Specific Gravity, UA: >1.03 — ABNORMAL HIGH (ref 1.005–1.030)
Urobilinogen, Ur: 0.2 mg/dL (ref 0.2–1.0)
pH, UA: 6 (ref 5.0–7.5)

## 2019-11-14 LAB — MICROSCOPIC EXAMINATION: Renal Epithel, UA: NONE SEEN /hpf

## 2019-11-14 NOTE — Patient Instructions (Signed)
Health Maintenance, Female Adopting a healthy lifestyle and getting preventive care are important in promoting health and wellness. Ask your health care provider about:  The right schedule for you to have regular tests and exams.  Things you can do on your own to prevent diseases and keep yourself healthy. What should I know about diet, weight, and exercise? Eat a healthy diet   Eat a diet that includes plenty of vegetables, fruits, low-fat dairy products, and lean protein.  Do not eat a lot of foods that are high in solid fats, added sugars, or sodium. Maintain a healthy weight Body mass index (BMI) is used to identify weight problems. It estimates body fat based on height and weight. Your health care provider can help determine your BMI and help you achieve or maintain a healthy weight. Get regular exercise Get regular exercise. This is one of the most important things you can do for your health. Most adults should:  Exercise for at least 150 minutes each week. The exercise should increase your heart rate and make you sweat (moderate-intensity exercise).  Do strengthening exercises at least twice a week. This is in addition to the moderate-intensity exercise.  Spend less time sitting. Even light physical activity can be beneficial. Watch cholesterol and blood lipids Have your blood tested for lipids and cholesterol at 55 years of age, then have this test every 5 years. Have your cholesterol levels checked more often if:  Your lipid or cholesterol levels are high.  You are older than 55 years of age.  You are at high risk for heart disease. What should I know about cancer screening? Depending on your health history and family history, you may need to have cancer screening at various ages. This may include screening for:  Breast cancer.  Cervical cancer.  Colorectal cancer.  Skin cancer.  Lung cancer. What should I know about heart disease, diabetes, and high blood  pressure? Blood pressure and heart disease  High blood pressure causes heart disease and increases the risk of stroke. This is more likely to develop in people who have high blood pressure readings, are of African descent, or are overweight.  Have your blood pressure checked: ? Every 3-5 years if you are 18-39 years of age. ? Every year if you are 40 years old or older. Diabetes Have regular diabetes screenings. This checks your fasting blood sugar level. Have the screening done:  Once every three years after age 40 if you are at a normal weight and have a low risk for diabetes.  More often and at a younger age if you are overweight or have a high risk for diabetes. What should I know about preventing infection? Hepatitis B If you have a higher risk for hepatitis B, you should be screened for this virus. Talk with your health care provider to find out if you are at risk for hepatitis B infection. Hepatitis C Testing is recommended for:  Everyone born from 1945 through 1965.  Anyone with known risk factors for hepatitis C. Sexually transmitted infections (STIs)  Get screened for STIs, including gonorrhea and chlamydia, if: ? You are sexually active and are younger than 55 years of age. ? You are older than 55 years of age and your health care provider tells you that you are at risk for this type of infection. ? Your sexual activity has changed since you were last screened, and you are at increased risk for chlamydia or gonorrhea. Ask your health care provider if   you are at risk.  Ask your health care provider about whether you are at high risk for HIV. Your health care provider may recommend a prescription medicine to help prevent HIV infection. If you choose to take medicine to prevent HIV, you should first get tested for HIV. You should then be tested every 3 months for as long as you are taking the medicine. Pregnancy  If you are about to stop having your period (premenopausal) and  you may become pregnant, seek counseling before you get pregnant.  Take 400 to 800 micrograms (mcg) of folic acid every day if you become pregnant.  Ask for birth control (contraception) if you want to prevent pregnancy. Osteoporosis and menopause Osteoporosis is a disease in which the bones lose minerals and strength with aging. This can result in bone fractures. If you are 65 years old or older, or if you are at risk for osteoporosis and fractures, ask your health care provider if you should:  Be screened for bone loss.  Take a calcium or vitamin D supplement to lower your risk of fractures.  Be given hormone replacement therapy (HRT) to treat symptoms of menopause. Follow these instructions at home: Lifestyle  Do not use any products that contain nicotine or tobacco, such as cigarettes, e-cigarettes, and chewing tobacco. If you need help quitting, ask your health care provider.  Do not use street drugs.  Do not share needles.  Ask your health care provider for help if you need support or information about quitting drugs. Alcohol use  Do not drink alcohol if: ? Your health care provider tells you not to drink. ? You are pregnant, may be pregnant, or are planning to become pregnant.  If you drink alcohol: ? Limit how much you use to 0-1 drink a day. ? Limit intake if you are breastfeeding.  Be aware of how much alcohol is in your drink. In the [place] one drink equals one 12 oz bottle of beer (355 mL), one 5 oz glass of wine (148 mL), or one 1 oz glass of hard liquor (44 mL). General instructions  Schedule regular health, dental, and eye exams.  Stay current with your vaccines.  Tell your health care provider if: ? You often feel depressed. ? You have ever been abused or do not feel safe at home. Summary  Adopting a healthy lifestyle and getting preventive care are important in promoting health and wellness.  Follow your health care provider's instructions about healthy  diet, exercising, and getting tested or screened for diseases.  Follow your health care provider's instructions on monitoring your cholesterol and blood pressure. This information is not intended to replace advice given to you by your health care provider. Make sure you discuss any questions you have with your health care provider. Document Released: 06/12/2011 Document Revised: 11/20/2018 Document Reviewed: 11/20/2018 Elsevier Patient Education  2020 Elsevier Inc.  

## 2019-11-14 NOTE — Progress Notes (Signed)
Subjective:    Patient ID: Grace Gomez, female    DOB: 1966-08-31, 53 y.o.   MRN: [id number]  Chief Complaint  Patient presents with  . Gynecologic Exam   PT presents to the office today for CPE with pap. PT has right breast cancer and is starting XRT next week.  Gynecologic Exam The patient's pertinent negatives include no genital lesions, genital odor or vaginal discharge. The problem occurs intermittently. Associated symptoms include diarrhea.  Hypertension This is a chronic problem. The current episode started more than 1 year ago. The problem has been waxing and waning since onset. The problem is uncontrolled. Pertinent negatives include no malaise/fatigue, peripheral edema or shortness of breath. Risk factors for coronary artery disease include dyslipidemia, obesity and sedentary lifestyle. The current treatment provides moderate improvement. There is no history of kidney disease or heart failure.  Gastroesophageal Reflux She complains of belching and heartburn. This is a chronic problem. The current episode started more than 1 year ago. The problem occurs occasionally. Risk factors include obesity. She has tried a PPI for the symptoms. The treatment provided moderate relief.  Hyperlipidemia This is a chronic problem. The current episode started more than 1 year ago. Exacerbating diseases include obesity. Pertinent negatives include no shortness of breath. Current antihyperlipidemic treatment includes statins. The current treatment provides moderate improvement of lipids. Risk factors for coronary artery disease include dyslipidemia, hypertension, post-menopausal and a sedentary lifestyle.  Diarrhea  This is a chronic problem. The current episode started more than 1 year ago. The problem occurs less than 2 times per day. The problem has been waxing and waning. The symptoms are aggravated by stress. The treatment provided mild relief.      Review of Systems  Constitutional: Negative  for malaise/fatigue.  Respiratory: Negative for shortness of breath.   Gastrointestinal: Positive for diarrhea and heartburn.  Genitourinary: Negative for vaginal discharge.  All other systems reviewed and are negative.   Family History  Problem Relation Age of Onset  . Heart disease Mother 13  . Hypertension Mother   . Cancer Father   . Heart disease Brother   . Heart disease Maternal Aunt 46  . Colon cancer Maternal Grandmother   . Crohn's disease Cousin   . Irritable bowel syndrome Daughter    Social History   Socioeconomic History  . Marital status: Single    Spouse name: Not on file  . Number of children: 3  . Years of education: Not on file  . Highest education level: Not on file  Occupational History  . Occupation: Doctor, general practice  Social Needs  . Financial resource strain: Not on file  . Food insecurity    Worry: Not on file    Inability: Not on file  . Transportation needs    Medical: Not on file    Non-medical: Not on file  Tobacco Use  . Smoking status: Former Smoker    Packs/day: 1.00    Types: Cigarettes    Start date: 07/06/1980    Quit date: 08/20/2001    Years since quitting: 18.2  . Smokeless tobacco: Never Used  Substance and Sexual Activity  . Alcohol use: No  . Drug use: No  . Sexual activity: Not on file  Lifestyle  . Physical activity    Days per week: Not on file    Minutes per session: Not on file  . Stress: Not on file  Relationships  . Social connections    Talks on phone: Not on  file    Gets together: Not on file    Attends religious service: Not on file    Active member of club or organization: Not on file    Attends meetings of clubs or organizations: Not on file    Relationship status: Not on file  Other Topics Concern  . Not on file  Social History Narrative  . Not on file       Objective:   Physical Exam Vitals signs reviewed.  Constitutional:      General: She is not in acute distress.    Appearance: She  is well-developed.  HENT:     Head: Normocephalic and atraumatic.     Right Ear: Tympanic membrane normal.     Left Ear: Tympanic membrane normal.  Eyes:     Pupils: Pupils are equal, round, and reactive to light.  Neck:     Musculoskeletal: Normal range of motion and neck supple.     Thyroid: No thyromegaly.  Cardiovascular:     Rate and Rhythm: Normal rate and regular rhythm.     Heart sounds: Normal heart sounds. No murmur.  Pulmonary:     Effort: Pulmonary effort is normal. No respiratory distress.     Breath sounds: Normal breath sounds. No wheezing.  Chest:     Breasts:        Right: No swelling, bleeding, inverted nipple, skin change or tenderness.        Left: No swelling, bleeding, inverted nipple, mass, nipple discharge, skin change or tenderness.     Comments: Surgery noted on right breast  Abdominal:     General: Bowel sounds are normal. There is no distension.     Palpations: Abdomen is soft.     Tenderness: There is no abdominal tenderness.  Genitourinary:    General: Normal vulva.     Comments: Bimanual exam- no adnexal masses or tenderness, ovaries nonpalpable   Cervix parous and pink- No discharge  Musculoskeletal: Normal range of motion.        General: No tenderness.  Skin:    General: Skin is warm and dry.  Neurological:     Mental Status: She is alert and oriented to person, place, and time.     Cranial Nerves: No cranial nerve deficit.     Deep Tendon Reflexes: Reflexes are normal and symmetric.  Psychiatric:        Behavior: Behavior normal.        Thought Content: Thought content normal.        Judgment: Judgment normal.       BP (!) 142/80   Pulse 85   Temp 97.8 F (36.6 C) (Temporal)   Ht 5' 2.5" (1.588 m)   Wt 197 lb 9.6 oz (89.6 kg)   LMP 01/18/2018   SpO2 100%   BMI 35.57 kg/m      Assessment & Plan:  Grace Gomez comes in today with chief complaint of Gynecologic Exam   Diagnosis and orders addressed:  1. Gynecologic exam  normal - urinalysis- dip and micro - CMP14+EGFR - PAP age 49-65  2. Essential hypertension - CMP14+EGFR  3. Gastroesophageal reflux disease, unspecified whether esophagitis present - CMP14+EGFR  4. Vitamin D deficiency - CMP14+EGFR  5. Mixed hyperlipidemia - CMP14+EGFR - Lipid panel  6. Irritable bowel syndrome with diarrhea  - CMP14+EGFR  7. Annual physical exam - CBC with Differential/Platelet - CMP14+EGFR - Lipid panel - TSH - PAP age 110-65   Labs pending Health Maintenance reviewed Diet  and exercise encouraged  Follow up plan: 1 year   Evelina Dun, FNP

## 2019-11-15 LAB — CBC WITH DIFFERENTIAL/PLATELET
Basophils Absolute: 0.1 10*3/uL (ref 0.0–0.2)
Basos: 1 %
EOS (ABSOLUTE): 0.2 10*3/uL (ref 0.0–0.4)
Eos: 2 %
Hematocrit: 40.5 % (ref 34.0–46.6)
Hemoglobin: 14.1 g/dL (ref 11.1–15.9)
Immature Grans (Abs): 0 10*3/uL (ref 0.0–0.1)
Immature Granulocytes: 0 %
Lymphocytes Absolute: 2.7 10*3/uL (ref 0.7–3.1)
Lymphs: 32 %
MCH: 30.2 pg (ref 26.6–33.0)
MCHC: 34.8 g/dL (ref 31.5–35.7)
MCV: 87 fL (ref 79–97)
Monocytes Absolute: 0.6 10*3/uL (ref 0.1–0.9)
Monocytes: 7 %
Neutrophils Absolute: 4.8 10*3/uL (ref 1.4–7.0)
Neutrophils: 58 %
Platelets: 275 10*3/uL (ref 150–450)
RBC: 4.67 x10E6/uL (ref 3.77–5.28)
RDW: 13.1 % (ref 11.7–15.4)
WBC: 8.3 10*3/uL (ref 3.4–10.8)

## 2019-11-15 LAB — CMP14+EGFR
ALT: 17 IU/L (ref 0–32)
AST: 16 IU/L (ref 0–40)
Albumin/Globulin Ratio: 1.4 (ref 1.2–2.2)
Albumin: 4.1 g/dL (ref 3.8–4.9)
Alkaline Phosphatase: 126 IU/L — ABNORMAL HIGH (ref 39–117)
BUN/Creatinine Ratio: 24 — ABNORMAL HIGH (ref 9–23)
BUN: 20 mg/dL (ref 6–24)
Bilirubin Total: 0.3 mg/dL (ref 0.0–1.2)
CO2: 25 mmol/L (ref 20–29)
Calcium: 9.8 mg/dL (ref 8.7–10.2)
Chloride: 103 mmol/L (ref 96–106)
Creatinine, Ser: 0.83 mg/dL (ref 0.57–1.00)
GFR calc Af Amer: 92 mL/min/{1.73_m2} (ref >59)
GFR calc non Af Amer: 80 mL/min/{1.73_m2} (ref >59)
Globulin, Total: 2.9 g/dL (ref 1.5–4.5)
Glucose: 99 mg/dL (ref 65–99)
Potassium: 4.6 mmol/L (ref 3.5–5.2)
Sodium: 141 mmol/L (ref 134–144)
Total Protein: 7 g/dL (ref 6.0–8.5)

## 2019-11-15 LAB — LIPID PANEL
Chol/HDL Ratio: 5.8 ratio — ABNORMAL HIGH (ref 0.0–4.4)
Cholesterol, Total: 261 mg/dL — ABNORMAL HIGH (ref 100–199)
HDL: 45 mg/dL (ref >39)
LDL Chol Calc (NIH): 167 mg/dL — ABNORMAL HIGH (ref 0–99)
Triglycerides: 258 mg/dL — ABNORMAL HIGH (ref 0–149)
VLDL Cholesterol Cal: 49 mg/dL — ABNORMAL HIGH (ref 5–40)

## 2019-11-15 LAB — TSH: TSH: 1.64 u[IU]/mL (ref 0.450–4.500)

## 2019-11-19 DIAGNOSIS — Z510 Encounter for antineoplastic radiation therapy: Secondary | ICD-10-CM | POA: Diagnosis not present

## 2019-11-19 DIAGNOSIS — C50411 Malignant neoplasm of upper-outer quadrant of right female breast: Secondary | ICD-10-CM | POA: Diagnosis not present

## 2019-11-19 DIAGNOSIS — Z170 Estrogen receptor positive status [ER+]: Secondary | ICD-10-CM | POA: Diagnosis not present

## 2019-11-19 LAB — IGP, APTIMA HPV, RFX 16/18,45: HPV Aptima: NEGATIVE

## 2019-11-21 DIAGNOSIS — Z510 Encounter for antineoplastic radiation therapy: Secondary | ICD-10-CM | POA: Diagnosis not present

## 2019-11-21 DIAGNOSIS — Z170 Estrogen receptor positive status [ER+]: Secondary | ICD-10-CM | POA: Diagnosis not present

## 2019-11-21 DIAGNOSIS — C50411 Malignant neoplasm of upper-outer quadrant of right female breast: Secondary | ICD-10-CM | POA: Diagnosis not present

## 2019-11-24 ENCOUNTER — Telehealth: Payer: Self-pay | Admitting: Family

## 2019-11-24 DIAGNOSIS — C50411 Malignant neoplasm of upper-outer quadrant of right female breast: Secondary | ICD-10-CM | POA: Diagnosis not present

## 2019-11-24 DIAGNOSIS — Z510 Encounter for antineoplastic radiation therapy: Secondary | ICD-10-CM | POA: Diagnosis not present

## 2019-11-24 DIAGNOSIS — Z170 Estrogen receptor positive status [ER+]: Secondary | ICD-10-CM | POA: Diagnosis not present

## 2019-11-24 NOTE — Telephone Encounter (Signed)
Patient aware of results.

## 2019-11-25 DIAGNOSIS — Z170 Estrogen receptor positive status [ER+]: Secondary | ICD-10-CM | POA: Diagnosis not present

## 2019-11-25 DIAGNOSIS — C50411 Malignant neoplasm of upper-outer quadrant of right female breast: Secondary | ICD-10-CM | POA: Diagnosis not present

## 2019-11-25 DIAGNOSIS — Z510 Encounter for antineoplastic radiation therapy: Secondary | ICD-10-CM | POA: Diagnosis not present

## 2019-11-26 DIAGNOSIS — Z510 Encounter for antineoplastic radiation therapy: Secondary | ICD-10-CM | POA: Diagnosis not present

## 2019-11-26 DIAGNOSIS — Z170 Estrogen receptor positive status [ER+]: Secondary | ICD-10-CM | POA: Diagnosis not present

## 2019-11-26 DIAGNOSIS — C50411 Malignant neoplasm of upper-outer quadrant of right female breast: Secondary | ICD-10-CM | POA: Diagnosis not present

## 2019-11-27 DIAGNOSIS — Z510 Encounter for antineoplastic radiation therapy: Secondary | ICD-10-CM | POA: Diagnosis not present

## 2019-11-27 DIAGNOSIS — C50411 Malignant neoplasm of upper-outer quadrant of right female breast: Secondary | ICD-10-CM | POA: Diagnosis not present

## 2019-11-27 DIAGNOSIS — Z170 Estrogen receptor positive status [ER+]: Secondary | ICD-10-CM | POA: Diagnosis not present

## 2019-11-28 DIAGNOSIS — Z170 Estrogen receptor positive status [ER+]: Secondary | ICD-10-CM | POA: Diagnosis not present

## 2019-11-28 DIAGNOSIS — C50411 Malignant neoplasm of upper-outer quadrant of right female breast: Secondary | ICD-10-CM | POA: Diagnosis not present

## 2019-11-28 DIAGNOSIS — Z510 Encounter for antineoplastic radiation therapy: Secondary | ICD-10-CM | POA: Diagnosis not present

## 2019-12-01 DIAGNOSIS — C50411 Malignant neoplasm of upper-outer quadrant of right female breast: Secondary | ICD-10-CM | POA: Diagnosis not present

## 2019-12-01 DIAGNOSIS — Z170 Estrogen receptor positive status [ER+]: Secondary | ICD-10-CM | POA: Diagnosis not present

## 2019-12-01 DIAGNOSIS — Z510 Encounter for antineoplastic radiation therapy: Secondary | ICD-10-CM | POA: Diagnosis not present

## 2019-12-02 DIAGNOSIS — C50411 Malignant neoplasm of upper-outer quadrant of right female breast: Secondary | ICD-10-CM | POA: Diagnosis not present

## 2019-12-02 DIAGNOSIS — Z170 Estrogen receptor positive status [ER+]: Secondary | ICD-10-CM | POA: Diagnosis not present

## 2019-12-02 DIAGNOSIS — Z510 Encounter for antineoplastic radiation therapy: Secondary | ICD-10-CM | POA: Diagnosis not present

## 2019-12-03 DIAGNOSIS — Z510 Encounter for antineoplastic radiation therapy: Secondary | ICD-10-CM | POA: Diagnosis not present

## 2019-12-03 DIAGNOSIS — Z170 Estrogen receptor positive status [ER+]: Secondary | ICD-10-CM | POA: Diagnosis not present

## 2019-12-03 DIAGNOSIS — C50411 Malignant neoplasm of upper-outer quadrant of right female breast: Secondary | ICD-10-CM | POA: Diagnosis not present

## 2019-12-08 DIAGNOSIS — C50411 Malignant neoplasm of upper-outer quadrant of right female breast: Secondary | ICD-10-CM | POA: Diagnosis not present

## 2019-12-08 DIAGNOSIS — Z510 Encounter for antineoplastic radiation therapy: Secondary | ICD-10-CM | POA: Diagnosis not present

## 2019-12-08 DIAGNOSIS — Z170 Estrogen receptor positive status [ER+]: Secondary | ICD-10-CM | POA: Diagnosis not present

## 2019-12-09 DIAGNOSIS — Z170 Estrogen receptor positive status [ER+]: Secondary | ICD-10-CM | POA: Diagnosis not present

## 2019-12-09 DIAGNOSIS — Z510 Encounter for antineoplastic radiation therapy: Secondary | ICD-10-CM | POA: Diagnosis not present

## 2019-12-09 DIAGNOSIS — C50411 Malignant neoplasm of upper-outer quadrant of right female breast: Secondary | ICD-10-CM | POA: Diagnosis not present

## 2019-12-10 DIAGNOSIS — C50411 Malignant neoplasm of upper-outer quadrant of right female breast: Secondary | ICD-10-CM | POA: Diagnosis not present

## 2019-12-10 DIAGNOSIS — Z170 Estrogen receptor positive status [ER+]: Secondary | ICD-10-CM | POA: Diagnosis not present

## 2019-12-10 DIAGNOSIS — Z510 Encounter for antineoplastic radiation therapy: Secondary | ICD-10-CM | POA: Diagnosis not present

## 2019-12-11 DIAGNOSIS — Z510 Encounter for antineoplastic radiation therapy: Secondary | ICD-10-CM | POA: Diagnosis not present

## 2019-12-11 DIAGNOSIS — Z170 Estrogen receptor positive status [ER+]: Secondary | ICD-10-CM | POA: Diagnosis not present

## 2019-12-11 DIAGNOSIS — C50411 Malignant neoplasm of upper-outer quadrant of right female breast: Secondary | ICD-10-CM | POA: Diagnosis not present

## 2019-12-15 DIAGNOSIS — L309 Dermatitis, unspecified: Secondary | ICD-10-CM | POA: Diagnosis not present

## 2019-12-15 DIAGNOSIS — Z170 Estrogen receptor positive status [ER+]: Secondary | ICD-10-CM | POA: Diagnosis not present

## 2019-12-15 DIAGNOSIS — C50411 Malignant neoplasm of upper-outer quadrant of right female breast: Secondary | ICD-10-CM | POA: Diagnosis not present

## 2019-12-16 DIAGNOSIS — Z170 Estrogen receptor positive status [ER+]: Secondary | ICD-10-CM | POA: Diagnosis not present

## 2019-12-16 DIAGNOSIS — C50411 Malignant neoplasm of upper-outer quadrant of right female breast: Secondary | ICD-10-CM | POA: Diagnosis not present

## 2019-12-16 DIAGNOSIS — L309 Dermatitis, unspecified: Secondary | ICD-10-CM | POA: Diagnosis not present

## 2019-12-17 DIAGNOSIS — C50411 Malignant neoplasm of upper-outer quadrant of right female breast: Secondary | ICD-10-CM | POA: Diagnosis not present

## 2019-12-17 DIAGNOSIS — L309 Dermatitis, unspecified: Secondary | ICD-10-CM | POA: Diagnosis not present

## 2019-12-17 DIAGNOSIS — Z170 Estrogen receptor positive status [ER+]: Secondary | ICD-10-CM | POA: Diagnosis not present

## 2019-12-18 DIAGNOSIS — Z170 Estrogen receptor positive status [ER+]: Secondary | ICD-10-CM | POA: Diagnosis not present

## 2019-12-18 DIAGNOSIS — C50411 Malignant neoplasm of upper-outer quadrant of right female breast: Secondary | ICD-10-CM | POA: Diagnosis not present

## 2019-12-18 DIAGNOSIS — L309 Dermatitis, unspecified: Secondary | ICD-10-CM | POA: Diagnosis not present

## 2019-12-22 DIAGNOSIS — L309 Dermatitis, unspecified: Secondary | ICD-10-CM | POA: Diagnosis not present

## 2019-12-22 DIAGNOSIS — Z170 Estrogen receptor positive status [ER+]: Secondary | ICD-10-CM | POA: Diagnosis not present

## 2019-12-22 DIAGNOSIS — C50411 Malignant neoplasm of upper-outer quadrant of right female breast: Secondary | ICD-10-CM | POA: Diagnosis not present

## 2019-12-23 DIAGNOSIS — Z170 Estrogen receptor positive status [ER+]: Secondary | ICD-10-CM | POA: Diagnosis not present

## 2019-12-23 DIAGNOSIS — L309 Dermatitis, unspecified: Secondary | ICD-10-CM | POA: Diagnosis not present

## 2019-12-23 DIAGNOSIS — C50411 Malignant neoplasm of upper-outer quadrant of right female breast: Secondary | ICD-10-CM | POA: Diagnosis not present

## 2019-12-24 DIAGNOSIS — C50411 Malignant neoplasm of upper-outer quadrant of right female breast: Secondary | ICD-10-CM | POA: Diagnosis not present

## 2019-12-24 DIAGNOSIS — L309 Dermatitis, unspecified: Secondary | ICD-10-CM | POA: Diagnosis not present

## 2019-12-24 DIAGNOSIS — Z170 Estrogen receptor positive status [ER+]: Secondary | ICD-10-CM | POA: Diagnosis not present

## 2019-12-25 DIAGNOSIS — C50411 Malignant neoplasm of upper-outer quadrant of right female breast: Secondary | ICD-10-CM | POA: Diagnosis not present

## 2019-12-25 DIAGNOSIS — Z170 Estrogen receptor positive status [ER+]: Secondary | ICD-10-CM | POA: Diagnosis not present

## 2019-12-25 DIAGNOSIS — L309 Dermatitis, unspecified: Secondary | ICD-10-CM | POA: Diagnosis not present

## 2019-12-26 DIAGNOSIS — Z170 Estrogen receptor positive status [ER+]: Secondary | ICD-10-CM | POA: Diagnosis not present

## 2019-12-26 DIAGNOSIS — L309 Dermatitis, unspecified: Secondary | ICD-10-CM | POA: Diagnosis not present

## 2019-12-26 DIAGNOSIS — C50411 Malignant neoplasm of upper-outer quadrant of right female breast: Secondary | ICD-10-CM | POA: Diagnosis not present

## 2020-01-02 ENCOUNTER — Other Ambulatory Visit: Payer: Self-pay

## 2020-01-02 DIAGNOSIS — C50911 Malignant neoplasm of unspecified site of right female breast: Secondary | ICD-10-CM | POA: Diagnosis not present

## 2020-01-02 DIAGNOSIS — E663 Overweight: Secondary | ICD-10-CM | POA: Diagnosis not present

## 2020-01-02 DIAGNOSIS — Z6834 Body mass index (BMI) 34.0-34.9, adult: Secondary | ICD-10-CM | POA: Diagnosis not present

## 2020-01-02 DIAGNOSIS — I10 Essential (primary) hypertension: Secondary | ICD-10-CM | POA: Diagnosis not present

## 2020-01-02 DIAGNOSIS — Z170 Estrogen receptor positive status [ER+]: Secondary | ICD-10-CM | POA: Diagnosis not present

## 2020-01-02 DIAGNOSIS — E559 Vitamin D deficiency, unspecified: Secondary | ICD-10-CM | POA: Diagnosis not present

## 2020-01-02 DIAGNOSIS — Z6831 Body mass index (BMI) 31.0-31.9, adult: Secondary | ICD-10-CM | POA: Diagnosis not present

## 2020-01-02 DIAGNOSIS — K219 Gastro-esophageal reflux disease without esophagitis: Secondary | ICD-10-CM | POA: Diagnosis not present

## 2020-01-05 ENCOUNTER — Ambulatory Visit: Payer: BC Managed Care – PPO | Admitting: Family

## 2020-01-05 ENCOUNTER — Encounter: Payer: Self-pay | Admitting: Family

## 2020-01-05 ENCOUNTER — Other Ambulatory Visit: Payer: Self-pay

## 2020-01-05 VITALS — BP 131/76 | HR 78 | Temp 96.2°F | Ht 62.5 in | Wt 201.0 lb

## 2020-01-05 DIAGNOSIS — E559 Vitamin D deficiency, unspecified: Secondary | ICD-10-CM

## 2020-01-05 DIAGNOSIS — F411 Generalized anxiety disorder: Secondary | ICD-10-CM

## 2020-01-05 DIAGNOSIS — Z170 Estrogen receptor positive status [ER+]: Secondary | ICD-10-CM

## 2020-01-05 DIAGNOSIS — R030 Elevated blood-pressure reading, without diagnosis of hypertension: Secondary | ICD-10-CM

## 2020-01-05 DIAGNOSIS — R5383 Other fatigue: Secondary | ICD-10-CM | POA: Diagnosis not present

## 2020-01-05 DIAGNOSIS — C50411 Malignant neoplasm of upper-outer quadrant of right female breast: Secondary | ICD-10-CM

## 2020-01-05 DIAGNOSIS — E785 Hyperlipidemia, unspecified: Secondary | ICD-10-CM | POA: Diagnosis not present

## 2020-01-05 MED ORDER — NYSTATIN 100000 UNIT/GM EX POWD: 1.0000 "application " | Freq: Three times a day (TID) | CUTANEOUS | 0 refills | Status: DC

## 2020-01-05 NOTE — Patient Instructions (Signed)

## 2020-01-05 NOTE — Progress Notes (Signed)
Subjective:    Patient ID: Grace Gomez, female    DOB: [birth year], 54 y.o.   MRN: [id number]  Chief Complaint  Patient presents with  . Hypertension    Wants to go over thyroid and Vit D results with provider     HPI Pt presents to the office today to recheck BP. Pt states when she had her BP checked at her Oncologists and it has been elevated. She is anxious and very worried about it.   She has right breast cancer. She completed XRT 12/26/19. She reports she is having constant burning pain of her right breast that is opened that is a 7 out 10. She has been applying  aquaphor and mometasone cream with mild relief.   She has fatigue and wants lab work today.    Review of Systems  Constitutional: Positive for fatigue.  All other systems reviewed and are negative.      Objective:   Physical Exam Vitals reviewed.  Constitutional:      General: She is not in acute distress.    Appearance: She is well-developed.  HENT:     Head: Normocephalic and atraumatic.     Right Ear: Tympanic membrane normal.     Left Ear: Tympanic membrane normal.  Eyes:     Pupils: Pupils are equal, round, and reactive to light.  Neck:     Thyroid: No thyromegaly.  Cardiovascular:     Rate and Rhythm: Normal rate and regular rhythm.     Heart sounds: Normal heart sounds. No murmur.  Pulmonary:     Effort: Pulmonary effort is normal. No respiratory distress.     Breath sounds: Normal breath sounds. No wheezing.  Chest:     Breasts:        Right: Skin change and tenderness present.        Comments: Right breast erythemas, opened area, no discharge.  Abdominal:     General: Bowel sounds are normal. There is no distension.     Palpations: Abdomen is soft.     Tenderness: There is no abdominal tenderness.  Musculoskeletal:        General: No tenderness. Normal range of motion.     Cervical back: Normal range of motion and neck supple.  Skin:    General: Skin is warm and dry.  Neurological:       Mental Status: She is alert and oriented to person, place, and time.     Cranial Nerves: No cranial nerve deficit.     Deep Tendon Reflexes: Reflexes are normal and symmetric.  Psychiatric:        Mood and Affect: Mood is anxious.        Behavior: Behavior normal.        Thought Content: Thought content normal.        Judgment: Judgment normal.      BP 131/76   Pulse 78   Temp (!) 96.2 F (35.7 C) (Temporal)   Ht 5' 2.5" (1.588 m)   Wt 201 lb (91.2 kg)   LMP 01/18/2018   SpO2 97%   BMI 36.18 kg/m       Assessment & Plan:  [name] comes in today with chief complaint of Hypertension (Wants to go over thyroid and Vit D results with provider )   Diagnosis and orders addressed:  1. Elevated blood pressure reading - CBC with Differential/Platelet - CMP14+EGFR  2. Malignant neoplasm of upper-outer quadrant of right breast in female, estrogen receptor positive (  [place]) - CBC with Differential/Platelet - CMP14+EGFR  3. Carcinoma of upper-outer quadrant of right breast in female, estrogen receptor positive ([place]) - CBC with Differential/Platelet - CMP14+EGFR  4. Other fatigue - CBC with Differential/Platelet - CMP14+EGFR - Thyroid Panel With TSH  5. Vitamin D deficiency - CBC with Differential/Platelet - CMP14+EGFR - VITAMIN D 25 Hydroxy (Vit-D Deficiency, Fractures)  6. GAD (generalized anxiety disorder) I believe her anxiety is playing a role for her elevated BP   Labs pending Health Maintenance reviewed Diet and exercise encouraged  Follow up plan: 3 months    Evelina Dun, FNP

## 2020-01-06 ENCOUNTER — Other Ambulatory Visit: Payer: Self-pay | Admitting: Family

## 2020-01-06 LAB — CBC WITH DIFFERENTIAL/PLATELET
Basophils Absolute: 0.1 10*3/uL (ref 0.0–0.2)
Basos: 1 %
EOS (ABSOLUTE): 0.2 10*3/uL (ref 0.0–0.4)
Eos: 2 %
Hematocrit: 42.3 % (ref 34.0–46.6)
Hemoglobin: 14.5 g/dL (ref 11.1–15.9)
Immature Grans (Abs): 0 10*3/uL (ref 0.0–0.1)
Immature Granulocytes: 0 %
Lymphocytes Absolute: 1.7 10*3/uL (ref 0.7–3.1)
Lymphs: 23 %
MCH: 30.1 pg (ref 26.6–33.0)
MCHC: 34.3 g/dL (ref 31.5–35.7)
MCV: 88 fL (ref 79–97)
Monocytes Absolute: 0.5 10*3/uL (ref 0.1–0.9)
Monocytes: 7 %
Neutrophils Absolute: 5 10*3/uL (ref 1.4–7.0)
Neutrophils: 67 %
Platelets: 239 10*3/uL (ref 150–450)
RBC: 4.81 x10E6/uL (ref 3.77–5.28)
RDW: 13 % (ref 11.7–15.4)
WBC: 7.4 10*3/uL (ref 3.4–10.8)

## 2020-01-06 LAB — CMP14+EGFR
ALT: 22 IU/L (ref 0–32)
AST: 20 IU/L (ref 0–40)
Albumin/Globulin Ratio: 1.5 (ref 1.2–2.2)
Albumin: 4.1 g/dL (ref 3.8–4.9)
Alkaline Phosphatase: 131 IU/L — ABNORMAL HIGH (ref 39–117)
BUN/Creatinine Ratio: 20 (ref 9–23)
BUN: 16 mg/dL (ref 6–24)
Bilirubin Total: 0.4 mg/dL (ref 0.0–1.2)
CO2: 23 mmol/L (ref 20–29)
Calcium: 9.9 mg/dL (ref 8.7–10.2)
Chloride: 99 mmol/L (ref 96–106)
Creatinine, Ser: 0.82 mg/dL (ref 0.57–1.00)
GFR calc Af Amer: 93 mL/min/{1.73_m2} (ref >59)
GFR calc non Af Amer: 81 mL/min/{1.73_m2} (ref >59)
Globulin, Total: 2.7 g/dL (ref 1.5–4.5)
Glucose: 88 mg/dL (ref 65–99)
Potassium: 5 mmol/L (ref 3.5–5.2)
Sodium: 139 mmol/L (ref 134–144)
Total Protein: 6.8 g/dL (ref 6.0–8.5)

## 2020-01-06 LAB — THYROID PANEL WITH TSH
Free Thyroxine Index: 1.9 (ref 1.2–4.9)
T3 Uptake Ratio: 23 % — ABNORMAL LOW (ref 24–39)
T4, Total: 8.1 ug/dL (ref 4.5–12.0)
TSH: 1.82 u[IU]/mL (ref 0.450–4.500)

## 2020-01-06 LAB — VITAMIN D 25 HYDROXY (VIT D DEFICIENCY, FRACTURES): Vit D, 25-Hydroxy: 22.1 ng/mL — ABNORMAL LOW (ref 30.0–100.0)

## 2020-01-06 MED ORDER — VITAMIN D (ERGOCALCIFEROL) 1.25 MG (50000 UNIT) PO CAPS: 50000.0000 [IU] | ORAL_CAPSULE | ORAL | 3 refills | Status: DC

## 2020-01-09 DIAGNOSIS — Z6834 Body mass index (BMI) 34.0-34.9, adult: Secondary | ICD-10-CM | POA: Diagnosis not present

## 2020-01-09 DIAGNOSIS — C50911 Malignant neoplasm of unspecified site of right female breast: Secondary | ICD-10-CM | POA: Diagnosis not present

## 2020-01-09 DIAGNOSIS — Z170 Estrogen receptor positive status [ER+]: Secondary | ICD-10-CM | POA: Diagnosis not present

## 2020-01-09 DIAGNOSIS — E559 Vitamin D deficiency, unspecified: Secondary | ICD-10-CM | POA: Diagnosis not present

## 2020-01-09 DIAGNOSIS — Z6831 Body mass index (BMI) 31.0-31.9, adult: Secondary | ICD-10-CM | POA: Diagnosis not present

## 2020-01-09 DIAGNOSIS — K219 Gastro-esophageal reflux disease without esophagitis: Secondary | ICD-10-CM | POA: Diagnosis not present

## 2020-01-09 DIAGNOSIS — E663 Overweight: Secondary | ICD-10-CM | POA: Diagnosis not present

## 2020-01-09 DIAGNOSIS — I10 Essential (primary) hypertension: Secondary | ICD-10-CM | POA: Diagnosis not present

## 2020-01-23 DIAGNOSIS — Z08 Encounter for follow-up examination after completed treatment for malignant neoplasm: Secondary | ICD-10-CM | POA: Diagnosis not present

## 2020-01-23 DIAGNOSIS — Z170 Estrogen receptor positive status [ER+]: Secondary | ICD-10-CM | POA: Diagnosis not present

## 2020-01-23 DIAGNOSIS — C50911 Malignant neoplasm of unspecified site of right female breast: Secondary | ICD-10-CM | POA: Diagnosis not present

## 2020-01-23 DIAGNOSIS — Z853 Personal history of malignant neoplasm of breast: Secondary | ICD-10-CM | POA: Diagnosis not present

## 2020-01-28 DIAGNOSIS — Z170 Estrogen receptor positive status [ER+]: Secondary | ICD-10-CM | POA: Diagnosis not present

## 2020-01-28 DIAGNOSIS — C50411 Malignant neoplasm of upper-outer quadrant of right female breast: Secondary | ICD-10-CM | POA: Diagnosis not present

## 2020-02-03 ENCOUNTER — Telehealth: Payer: Self-pay | Admitting: Family

## 2020-02-03 NOTE — Telephone Encounter (Signed)
Patient aware of results and instruction 

## 2020-02-17 DIAGNOSIS — H04123 Dry eye syndrome of bilateral lacrimal glands: Secondary | ICD-10-CM | POA: Diagnosis not present

## 2020-02-17 DIAGNOSIS — H2513 Age-related nuclear cataract, bilateral: Secondary | ICD-10-CM | POA: Diagnosis not present

## 2020-03-24 ENCOUNTER — Ambulatory Visit: Payer: BC Managed Care – PPO | Admitting: Allergy & Immunology

## 2020-03-24 DIAGNOSIS — Z6834 Body mass index (BMI) 34.0-34.9, adult: Secondary | ICD-10-CM | POA: Diagnosis not present

## 2020-03-24 DIAGNOSIS — Z170 Estrogen receptor positive status [ER+]: Secondary | ICD-10-CM | POA: Diagnosis not present

## 2020-03-24 DIAGNOSIS — Z79811 Long term (current) use of aromatase inhibitors: Secondary | ICD-10-CM | POA: Diagnosis not present

## 2020-03-24 DIAGNOSIS — K219 Gastro-esophageal reflux disease without esophagitis: Secondary | ICD-10-CM | POA: Diagnosis not present

## 2020-03-24 DIAGNOSIS — E559 Vitamin D deficiency, unspecified: Secondary | ICD-10-CM | POA: Diagnosis not present

## 2020-03-24 DIAGNOSIS — I10 Essential (primary) hypertension: Secondary | ICD-10-CM | POA: Diagnosis not present

## 2020-03-24 DIAGNOSIS — C50911 Malignant neoplasm of unspecified site of right female breast: Secondary | ICD-10-CM | POA: Diagnosis not present

## 2020-03-24 DIAGNOSIS — Z9189 Other specified personal risk factors, not elsewhere classified: Secondary | ICD-10-CM | POA: Insufficient documentation

## 2020-03-26 ENCOUNTER — Ambulatory Visit: Payer: BC Managed Care – PPO | Admitting: Allergy & Immunology

## 2020-04-21 ENCOUNTER — Ambulatory Visit: Payer: BC Managed Care – PPO | Admitting: Allergy & Immunology

## 2020-05-04 DIAGNOSIS — C50911 Malignant neoplasm of unspecified site of right female breast: Secondary | ICD-10-CM | POA: Diagnosis not present

## 2020-05-24 DIAGNOSIS — C50411 Malignant neoplasm of upper-outer quadrant of right female breast: Secondary | ICD-10-CM | POA: Diagnosis not present

## 2020-05-24 DIAGNOSIS — Z170 Estrogen receptor positive status [ER+]: Secondary | ICD-10-CM | POA: Diagnosis not present

## 2020-06-22 DIAGNOSIS — C50911 Malignant neoplasm of unspecified site of right female breast: Secondary | ICD-10-CM | POA: Diagnosis not present

## 2020-06-22 DIAGNOSIS — Z6834 Body mass index (BMI) 34.0-34.9, adult: Secondary | ICD-10-CM | POA: Diagnosis not present

## 2020-06-22 DIAGNOSIS — Z853 Personal history of malignant neoplasm of breast: Secondary | ICD-10-CM | POA: Diagnosis not present

## 2020-06-22 DIAGNOSIS — I10 Essential (primary) hypertension: Secondary | ICD-10-CM | POA: Diagnosis not present

## 2020-06-22 DIAGNOSIS — Z08 Encounter for follow-up examination after completed treatment for malignant neoplasm: Secondary | ICD-10-CM | POA: Diagnosis not present

## 2020-06-22 DIAGNOSIS — L659 Nonscarring hair loss, unspecified: Secondary | ICD-10-CM | POA: Diagnosis not present

## 2020-07-22 ENCOUNTER — Other Ambulatory Visit: Payer: Self-pay | Admitting: Family

## 2020-07-22 DIAGNOSIS — Z1231 Encounter for screening mammogram for malignant neoplasm of breast: Secondary | ICD-10-CM

## 2020-08-04 ENCOUNTER — Other Ambulatory Visit: Payer: Self-pay

## 2020-08-04 ENCOUNTER — Encounter: Payer: Self-pay | Admitting: Family

## 2020-08-04 ENCOUNTER — Ambulatory Visit: Payer: BC Managed Care – PPO | Admitting: Family

## 2020-08-04 VITALS — BP 128/84 | HR 82 | Temp 98.2°F | Ht 62.5 in | Wt 189.4 lb

## 2020-08-04 DIAGNOSIS — D179 Benign lipomatous neoplasm, unspecified: Secondary | ICD-10-CM

## 2020-08-04 DIAGNOSIS — R350 Frequency of micturition: Secondary | ICD-10-CM

## 2020-08-04 DIAGNOSIS — K219 Gastro-esophageal reflux disease without esophagitis: Secondary | ICD-10-CM

## 2020-08-04 DIAGNOSIS — K580 Irritable bowel syndrome with diarrhea: Secondary | ICD-10-CM

## 2020-08-04 LAB — URINALYSIS, COMPLETE
Bilirubin, UA: NEGATIVE
Glucose, UA: NEGATIVE
Ketones, UA: NEGATIVE
Leukocytes,UA: NEGATIVE
Nitrite, UA: NEGATIVE
Protein,UA: NEGATIVE
Specific Gravity, UA: >1.03 — ABNORMAL HIGH (ref 1.005–1.030)
Urobilinogen, Ur: 0.2 mg/dL (ref 0.2–1.0)
pH, UA: 5.5 (ref 5.0–7.5)

## 2020-08-04 LAB — MICROSCOPIC EXAMINATION: Bacteria, UA: NONE SEEN

## 2020-08-04 MED ORDER — DICYCLOMINE HCL 10 MG PO CAPS: 10.0000 mg | ORAL_CAPSULE | Freq: Three times a day (TID) | ORAL | 2 refills | Status: DC

## 2020-08-04 MED ORDER — DEXILANT 60 MG PO CPDR: 60.0000 mg | DELAYED_RELEASE_CAPSULE | Freq: Every day | ORAL | 4 refills | Status: DC

## 2020-08-04 NOTE — Patient Instructions (Signed)
Lipoma  A lipoma is a noncancerous (benign) tumor that is made up of fat cells. This is a very common type of soft-tissue growth. Lipomas are usually found under the skin (subcutaneous). They may occur in any tissue of the body that contains fat. Common areas for lipomas to appear include the back, arms, shoulders, buttocks, and thighs. Lipomas grow slowly, and they are usually painless. Most lipomas do not cause problems and do not require treatment. What are the causes? The cause of this condition is not known. What increases the risk? You are more likely to develop this condition if:  You are 40-60 years old.  You have a family history of lipomas. What are the signs or symptoms? A lipoma usually appears as a small, round bump under the skin. In most cases, the lump will:  Feel soft or rubbery.  Not cause pain or other symptoms. However, if a lipoma is located in an area where it pushes on nerves, it can become painful or cause other symptoms. How is this diagnosed? A lipoma can usually be diagnosed with a physical exam. You may also have tests to confirm the diagnosis and to rule out other conditions. Tests may include:  Imaging tests, such as a CT scan or an MRI.  Removal of a tissue sample to be looked at under a microscope (biopsy). How is this treated? Treatment for this condition depends on the size of the lipoma and whether it is causing any symptoms.  For small lipomas that are not causing problems, no treatment is needed.  If a lipoma is bigger or it causes problems, surgery may be done to remove the lipoma. Lipomas can also be removed to improve appearance. Most often, the procedure is done after applying a medicine that numbs the area (local anesthetic).  Liposuction may be done to reduce the size of the lipoma before it is removed through surgery, or it may be done to remove the lipoma. Lipomas are removed with this method in order to limit incision size and scarring. A  liposuction tube is inserted through a small incision into the lipoma, and the contents of the lipoma are removed through the tube with suction. Follow these instructions at home:  Watch your lipoma for any changes.  Keep all follow-up visits as told by your health care provider. This is important. Contact a health care provider if:  Your lipoma becomes larger or hard.  Your lipoma becomes painful, red, or increasingly swollen. These could be signs of infection or a more serious condition. Get help right away if:  You develop tingling or numbness in an area near the lipoma. This could indicate that the lipoma is causing nerve damage. Summary  A lipoma is a noncancerous tumor that is made up of fat cells.  Most lipomas do not cause problems and do not require treatment.  If a lipoma is bigger or it causes problems, surgery may be done to remove the lipoma.  Contact a health care provider if your lipoma becomes larger or hard, or if it becomes painful, red, or increasingly swollen. Pain, redness, and swelling could be signs of infection or a more serious condition. This information is not intended to replace advice given to you by your health care provider. Make sure you discuss any questions you have with your health care provider. Document Revised: 07/14/2019 Document Reviewed: 07/14/2019 Elsevier Patient Education  2020 Elsevier Inc.  

## 2020-08-04 NOTE — Progress Notes (Signed)
Subjective:    Patient ID: Grace Gomez, female    DOB: 05/18/1966, 54 y.o.   MRN: [id number]  Chief Complaint  Patient presents with  . Mass    on stomachpt has IBS, Breast cancer hernia surgery.   Pt presents to the office today after her NP at work felt her stomach and noticed a mass in her LUQ. She states she does not feel it, but is anxious about. She has a hx Right breast cancer.   She also reports her IBS is worsening. She reports she is having diarrhea 2-3 times a day. She has a follow up with her GI next month.  She take IB guard.  Diarrhea  The problem occurs 2 to 4 times per day. The problem has been waxing and waning. Associated symptoms include abdominal pain, bloating and increased flatus. Pertinent negatives include no fever or vomiting. Associated symptoms comments: nausea. She has tried increased fluids for the symptoms. The treatment provided mild relief.      Review of Systems  Constitutional: Negative for fever.  Gastrointestinal: Positive for abdominal pain, bloating, diarrhea and flatus. Negative for vomiting.  All other systems reviewed and are negative.      Objective:   Physical Exam Vitals reviewed.  Constitutional:      General: She is not in acute distress.    Appearance: She is well-developed.  HENT:     Head: Normocephalic and atraumatic.  Eyes:     Pupils: Pupils are equal, round, and reactive to light.  Neck:     Thyroid: No thyromegaly.  Cardiovascular:     Rate and Rhythm: Normal rate and regular rhythm.     Heart sounds: Normal heart sounds. No murmur heard.   Pulmonary:     Effort: Pulmonary effort is normal. No respiratory distress.     Breath sounds: Normal breath sounds. No wheezing.  Abdominal:     General: Bowel sounds are normal. There is no distension.     Palpations: Abdomen is soft.     Tenderness: There is no abdominal tenderness.  Musculoskeletal:        General: No tenderness. Normal range of motion.     Cervical  back: Normal range of motion and neck supple.  Skin:    General: Skin is warm and dry.  Neurological:     Mental Status: She is alert and oriented to person, place, and time.     Cranial Nerves: No cranial nerve deficit.     Deep Tendon Reflexes: Reflexes are normal and symmetric.  Psychiatric:        Behavior: Behavior normal.        Thought Content: Thought content normal.        Judgment: Judgment normal.       BP 128/84   Pulse 82   Temp 98.2 F (36.8 C) (Temporal)   Ht 5' 2.5" (1.588 m)   Wt 189 lb 6.4 oz (85.9 kg)   LMP 01/18/2018   SpO2 95%   BMI 34.09 kg/m      Assessment & Plan:  Grace Gomez comes in today with chief complaint of Mass (on stomachpt has IBS, Breast cancer hernia surgery.)   Diagnosis and orders addressed:  1. Lipoma, unspecified site -Will do [place] to rule out, but feel like it is a lipoma - US Abdomen Limited; Future  2. Urinary frequency - Urinalysis, Complete - Urine Culture  3. Gastroesophageal reflux disease Will change Prilosec to Dexilant -Diet discussed- Avoid fried, spicy,  citrus foods, caffeine and alcohol -Do not eat 2-3 hours before bedtime -Encouraged small frequent meals -Avoid NSAID's - dexlansoprazole (DEXILANT) 60 MG capsule; Take 1 capsule (60 mg total) by mouth daily.  Dispense: 90 capsule; Refill: 4  4. Irritable bowel syndrome with diarrhea Will start Bentyl 10 mg QID Keep GI referral  - dicyclomine (BENTYL) 10 MG capsule; Take 1 capsule (10 mg total) by mouth 4 (four) times daily -  before meals and at bedtime.  Dispense: 120 capsule; Refill: [place], FNP

## 2020-08-06 ENCOUNTER — Telehealth: Payer: Self-pay | Admitting: Family

## 2020-08-06 LAB — URINE CULTURE

## 2020-08-06 NOTE — Telephone Encounter (Signed)
REFERRAL REQUEST Telephone Note  Have you been seen at our office for this problem? yes (Advise that they may need an appointment with their PCP before a referral can be done)  Reason for Referral: stomach ultrasound at [facility] Referral discussed with patient: yes Best contact number of patient for referral team:  [phone]  Has patient been seen by a specialist for this issue before: no Patient provider preference for referral: na Patient location preference for referral: na   Patient notified that referrals can take up to a week or longer to process. If they haven't heard anything within a week they should call back and speak with the referral department.

## 2020-08-07 DIAGNOSIS — R1084 Generalized abdominal pain: Secondary | ICD-10-CM | POA: Diagnosis not present

## 2020-08-07 DIAGNOSIS — Z87891 Personal history of nicotine dependence: Secondary | ICD-10-CM | POA: Diagnosis not present

## 2020-08-07 DIAGNOSIS — R110 Nausea: Secondary | ICD-10-CM | POA: Diagnosis not present

## 2020-08-09 ENCOUNTER — Telehealth: Payer: Self-pay | Admitting: Family

## 2020-08-09 DIAGNOSIS — R1084 Generalized abdominal pain: Secondary | ICD-10-CM

## 2020-08-09 DIAGNOSIS — R109 Unspecified abdominal pain: Secondary | ICD-10-CM | POA: Diagnosis not present

## 2020-08-09 DIAGNOSIS — K8020 Calculus of gallbladder without cholecystitis without obstruction: Secondary | ICD-10-CM | POA: Diagnosis not present

## 2020-08-09 NOTE — Telephone Encounter (Signed)
Pt was recently seen at in the ER and has an appt to see Dr Jacqulyn Liner on 08/17/20. A lady called from Dr Dahlia Client office stating that they have never seen pt before so they can't order labs for the pt. Wants to know if [name] can order labs for pt. Wants pt to have CBC, BMP, Panel, and Lipace.

## 2020-08-10 ENCOUNTER — Telehealth: Payer: Self-pay | Admitting: Family

## 2020-08-10 NOTE — Telephone Encounter (Signed)
Labs placed.

## 2020-08-11 ENCOUNTER — Other Ambulatory Visit: Payer: BC Managed Care – PPO

## 2020-08-11 ENCOUNTER — Other Ambulatory Visit: Payer: Self-pay

## 2020-08-11 DIAGNOSIS — R1084 Generalized abdominal pain: Secondary | ICD-10-CM | POA: Diagnosis not present

## 2020-08-11 NOTE — Telephone Encounter (Signed)
Patient aware and verbalized understanding. Wants you to look at U/S form [facility] in [place]?

## 2020-08-12 LAB — CBC WITH DIFFERENTIAL/PLATELET
Basophils Absolute: 0.1 10*3/uL (ref 0.0–0.2)
Basos: 1 %
EOS (ABSOLUTE): 0.2 10*3/uL (ref 0.0–0.4)
Eos: 3 %
Hematocrit: 42.5 % (ref 34.0–46.6)
Hemoglobin: 14.2 g/dL (ref 11.1–15.9)
Immature Grans (Abs): 0 10*3/uL (ref 0.0–0.1)
Immature Granulocytes: 0 %
Lymphocytes Absolute: 1.7 10*3/uL (ref 0.7–3.1)
Lymphs: 28 %
MCH: 30.3 pg (ref 26.6–33.0)
MCHC: 33.4 g/dL (ref 31.5–35.7)
MCV: 91 fL (ref 79–97)
Monocytes Absolute: 0.4 10*3/uL (ref 0.1–0.9)
Monocytes: 7 %
Neutrophils Absolute: 3.6 10*3/uL (ref 1.4–7.0)
Neutrophils: 61 %
Platelets: 246 10*3/uL (ref 150–450)
RBC: 4.69 x10E6/uL (ref 3.77–5.28)
RDW: 13.1 % (ref 11.7–15.4)
WBC: 5.9 10*3/uL (ref 3.4–10.8)

## 2020-08-12 LAB — BMP8+EGFR
BUN/Creatinine Ratio: 25 — ABNORMAL HIGH (ref 9–23)
BUN: 17 mg/dL (ref 6–24)
CO2: 27 mmol/L (ref 20–29)
Calcium: 9.4 mg/dL (ref 8.7–10.2)
Chloride: 103 mmol/L (ref 96–106)
Creatinine, Ser: 0.69 mg/dL (ref 0.57–1.00)
GFR calc Af Amer: 113 mL/min/{1.73_m2} (ref >59)
GFR calc non Af Amer: 98 mL/min/{1.73_m2} (ref >59)
Glucose: 82 mg/dL (ref 65–99)
Potassium: 4.8 mmol/L (ref 3.5–5.2)
Sodium: 141 mmol/L (ref 134–144)

## 2020-08-12 LAB — LIPASE: Lipase: 33 U/L (ref 14–72)

## 2020-08-12 LAB — AMYLASE: Amylase: 24 U/L — ABNORMAL LOW (ref 31–110)

## 2020-08-12 NOTE — Telephone Encounter (Signed)
Large gallstone and sludge in the gallbladder. Keep GI appt.

## 2020-08-13 ENCOUNTER — Ambulatory Visit (HOSPITAL_COMMUNITY): Payer: BLUE CROSS/BLUE SHIELD

## 2020-08-17 DIAGNOSIS — K8020 Calculus of gallbladder without cholecystitis without obstruction: Secondary | ICD-10-CM | POA: Diagnosis not present

## 2020-08-17 DIAGNOSIS — Z6833 Body mass index (BMI) 33.0-33.9, adult: Secondary | ICD-10-CM | POA: Diagnosis not present

## 2020-08-17 DIAGNOSIS — R197 Diarrhea, unspecified: Secondary | ICD-10-CM | POA: Diagnosis not present

## 2020-08-25 DIAGNOSIS — R197 Diarrhea, unspecified: Secondary | ICD-10-CM | POA: Diagnosis not present

## 2020-08-25 DIAGNOSIS — K5900 Constipation, unspecified: Secondary | ICD-10-CM | POA: Diagnosis not present

## 2020-08-25 DIAGNOSIS — R1030 Lower abdominal pain, unspecified: Secondary | ICD-10-CM | POA: Diagnosis not present

## 2020-08-25 DIAGNOSIS — K219 Gastro-esophageal reflux disease without esophagitis: Secondary | ICD-10-CM | POA: Diagnosis not present

## 2020-09-07 ENCOUNTER — Ambulatory Visit: Payer: Self-pay | Admitting: Nurse Practitioner

## 2020-09-08 ENCOUNTER — Ambulatory Visit
Admission: RE | Admit: 2020-09-08 | Discharge: 2020-09-08 | Disposition: A | Discharge status: Home / Self Care | Payer: BC Managed Care – PPO | Source: Ambulatory Visit | Attending: Family | Admitting: Family

## 2020-09-08 ENCOUNTER — Other Ambulatory Visit: Payer: Self-pay

## 2020-09-08 DIAGNOSIS — Z1231 Encounter for screening mammogram for malignant neoplasm of breast: Secondary | ICD-10-CM

## 2020-09-22 DIAGNOSIS — R922 Inconclusive mammogram: Secondary | ICD-10-CM | POA: Diagnosis not present

## 2020-09-22 DIAGNOSIS — Z6829 Body mass index (BMI) 29.0-29.9, adult: Secondary | ICD-10-CM | POA: Diagnosis not present

## 2020-09-22 DIAGNOSIS — E663 Overweight: Secondary | ICD-10-CM | POA: Diagnosis not present

## 2020-09-22 DIAGNOSIS — K219 Gastro-esophageal reflux disease without esophagitis: Secondary | ICD-10-CM | POA: Diagnosis not present

## 2020-09-22 DIAGNOSIS — Z08 Encounter for follow-up examination after completed treatment for malignant neoplasm: Secondary | ICD-10-CM | POA: Diagnosis not present

## 2020-09-22 DIAGNOSIS — C50911 Malignant neoplasm of unspecified site of right female breast: Secondary | ICD-10-CM | POA: Diagnosis not present

## 2020-09-22 DIAGNOSIS — Z9011 Acquired absence of right breast and nipple: Secondary | ICD-10-CM | POA: Diagnosis not present

## 2020-09-22 DIAGNOSIS — Z9189 Other specified personal risk factors, not elsewhere classified: Secondary | ICD-10-CM | POA: Diagnosis not present

## 2020-09-22 DIAGNOSIS — Z1231 Encounter for screening mammogram for malignant neoplasm of breast: Secondary | ICD-10-CM | POA: Diagnosis not present

## 2020-09-22 DIAGNOSIS — Z853 Personal history of malignant neoplasm of breast: Secondary | ICD-10-CM | POA: Diagnosis not present

## 2020-09-22 DIAGNOSIS — I10 Essential (primary) hypertension: Secondary | ICD-10-CM | POA: Diagnosis not present

## 2020-10-11 ENCOUNTER — Ambulatory Visit (INDEPENDENT_AMBULATORY_CARE_PROVIDER_SITE_OTHER): Payer: BC Managed Care – PPO | Admitting: Family

## 2020-10-11 ENCOUNTER — Encounter: Payer: Self-pay | Admitting: Family

## 2020-10-11 VITALS — BP 160/91 | HR 75 | Temp 99.5°F | Ht 62.5 in | Wt 189.0 lb

## 2020-10-11 DIAGNOSIS — R509 Fever, unspecified: Secondary | ICD-10-CM | POA: Diagnosis not present

## 2020-10-11 DIAGNOSIS — J069 Acute upper respiratory infection, unspecified: Secondary | ICD-10-CM

## 2020-10-11 MED ORDER — DEXAMETHASONE 6 MG PO TABS: 6.0000 mg | ORAL_TABLET | Freq: Two times a day (BID) | ORAL | 0 refills | Status: DC

## 2020-10-11 MED ORDER — FLUTICASONE PROPIONATE 50 MCG/ACT NA SUSP: 2.0000 | Freq: Every day | NASAL | 6 refills | Status: DC

## 2020-10-11 NOTE — Progress Notes (Signed)
Subjective:    Patient ID: Grace Gomez, female    DOB: 22-Dec-1965, 54 y.o.   MRN: [id number]  Chief Complaint  Patient presents with  . Otalgia    both ears feels like fluid in them  . Nasal Congestion    scratchy throat   . Fever    Otalgia  There is pain in both ears. This is a new problem. The current episode started yesterday. The problem occurs constantly. Maximum temperature: 99. The pain is at a severity of 4/10. The pain is mild. Associated symptoms include rhinorrhea and a sore throat. Pertinent negatives include no coughing, ear discharge, headaches or hearing loss. Associated symptoms comments: Denies any loss of taste or smell. She has tried NSAIDs and acetaminophen for the symptoms. The treatment provided mild relief.  Fever  Associated symptoms include ear pain and a sore throat. Pertinent negatives include no coughing or headaches.      Review of Systems  Constitutional: Positive for fever.  HENT: Positive for ear pain, rhinorrhea and sore throat. Negative for ear discharge and hearing loss.   Respiratory: Negative for cough.   Neurological: Negative for headaches.  All other systems reviewed and are negative.      Objective:   Physical Exam Vitals reviewed.  Constitutional:      General: She is not in acute distress.    Appearance: She is well-developed.  HENT:     Head: Normocephalic and atraumatic.     Right Ear: A middle ear effusion is present. Tympanic membrane is bulging. Tympanic membrane is not erythematous.     Left Ear: A middle ear effusion is present. Tympanic membrane is bulging. Tympanic membrane is not erythematous.     Nose:     Right Turbinates: Pale.     Left Turbinates: Pale.  Eyes:     Pupils: Pupils are equal, round, and reactive to light.  Neck:     Thyroid: No thyromegaly.  Cardiovascular:     Rate and Rhythm: Normal rate and regular rhythm.     Heart sounds: Normal heart sounds. No murmur heard.   Pulmonary:     Effort:  Pulmonary effort is normal. No respiratory distress.     Breath sounds: Normal breath sounds. No wheezing.  Abdominal:     General: Bowel sounds are normal. There is no distension.     Palpations: Abdomen is soft.     Tenderness: There is no abdominal tenderness.  Musculoskeletal:        General: No tenderness. Normal range of motion.     Cervical back: Normal range of motion and neck supple.  Skin:    General: Skin is warm and dry.  Neurological:     Mental Status: She is alert and oriented to person, place, and time.     Cranial Nerves: No cranial nerve deficit.     Deep Tendon Reflexes: Reflexes are normal and symmetric.  Psychiatric:        Behavior: Behavior normal.        Thought Content: Thought content normal.        Judgment: Judgment normal.       BP (!) 160/91   Pulse 75   Temp 99.5 F (37.5 C) (Temporal)   Ht 5' 2.5" (1.588 m)   Wt 189 lb (85.7 kg)   LMP 01/18/2018   SpO2 98%   BMI 34.02 kg/m      Assessment & Plan:  Grace Gomez comes in today with chief complaint of  Otalgia (both ears feels like fluid in them), Nasal Congestion (scratchy throat ), and Fever   Diagnosis and orders addressed:  1. Fever, unspecified fever cause - Novel Coronavirus, NAA ([facility])  2. Viral URI - Take meds as prescribed - Use a cool mist humidifier  -Use saline nose sprays frequently -Force fluids -For any cough or congestion  Use plain Mucinex- regular strength or max strength is fine -For fever or aces or pains- take tylenol or ibuprofen. -Throat lozenges if help -RTO if symptoms worsen or do not improve COVID test pending  - dexamethasone (DECADRON) 6 MG tablet; Take 1 tablet (6 mg total) by mouth 2 (two) times daily with a meal.  Dispense: 14 tablet; Refill: 0 - fluticasone (FLONASE) 50 MCG/ACT nasal spray; Place 2 sprays into both nostrils daily.  Dispense: 16 g; Refill: [place], FNP

## 2020-10-11 NOTE — Patient Instructions (Signed)
Upper Respiratory Infection, Adult An upper respiratory infection (URI) is a common viral infection of the nose, throat, and upper air passages that lead to the lungs. The most common type of URI is the common cold. URIs usually get better on their own, without medical treatment. What are the causes? A URI is caused by a virus. You may catch a virus by:  Breathing in droplets from an infected person's cough or sneeze.  Touching something that has been exposed to the virus (contaminated) and then touching your mouth, nose, or eyes. What increases the risk? You are more likely to get a URI if:  You are very young or very old.  It is autumn or winter.  You have close contact with others, such as at a daycare, school, or health care facility.  You smoke.  You have long-term (chronic) heart or lung disease.  You have a weakened disease-fighting (immune) system.  You have nasal allergies or asthma.  You are experiencing a lot of stress.  You work in an area that has poor air circulation.  You have poor nutrition. What are the signs or symptoms? A URI usually involves some of the following symptoms:  Runny or stuffy (congested) nose.  Sneezing.  Cough.  Sore throat.  Headache.  Fatigue.  Fever.  Loss of appetite.  Pain in your forehead, behind your eyes, and over your cheekbones (sinus pain).  Muscle aches.  Redness or irritation of the eyes.  Pressure in the ears or face. How is this diagnosed? This condition may be diagnosed based on your medical history and symptoms, and a physical exam. Your health care provider may use a cotton swab to take a mucus sample from your nose (nasal swab). This sample can be tested to determine what virus is causing the illness. How is this treated? URIs usually get better on their own within 7-10 days. You can take steps at home to relieve your symptoms. Medicines cannot cure URIs, but your health care provider may recommend  certain medicines to help relieve symptoms, such as:  Over-the-counter cold medicines.  Cough suppressants. Coughing is a type of defense against infection that helps to clear the respiratory system, so take these medicines only as recommended by your health care provider.  Fever-reducing medicines. Follow these instructions at home: Activity  Rest as needed.  If you have a fever, stay home from work or school until your fever is gone or until your health care provider says you are no longer contagious. Your health care provider may have you wear a face mask to prevent your infection from spreading. Relieving symptoms  Gargle with a salt-water mixture 3-4 times a day or as needed. To make a salt-water mixture, completely dissolve -1 tsp of salt in 1 cup of warm water.  Use a cool-mist humidifier to add moisture to the air. This can help you breathe more easily. Eating and drinking   Drink enough fluid to keep your urine pale yellow.  Eat soups and other clear broths. General instructions   Take over-the-counter and prescription medicines only as told by your health care provider. These include cold medicines, fever reducers, and cough suppressants.  Do not use any products that contain nicotine or tobacco, such as cigarettes and e-cigarettes. If you need help quitting, ask your health care provider.  Stay away from secondhand smoke.  Stay up to date on all immunizations, including the yearly (annual) flu vaccine.  Keep all follow-up visits as told by your health   care provider. This is important. How to prevent the spread of infection to others   URIs can be passed from person to person (are contagious). To prevent the infection from spreading: ? Wash your hands often with soap and water. If soap and water are not available, use hand sanitizer. ? Avoid touching your mouth, face, eyes, or nose. ? Cough or sneeze into a tissue or your sleeve or elbow instead of into your hand  or into the air. Contact a health care provider if:  You are getting worse instead of better.  You have a fever or chills.  Your mucus is brown or red.  You have yellow or brown discharge coming from your nose.  You have pain in your face, especially when you bend forward.  You have swollen neck glands.  You have pain while swallowing.  You have [place] areas in the back of your throat. Get help right away if:  You have shortness of breath that gets worse.  You have severe or persistent: ? Headache. ? Ear pain. ? Sinus pain. ? Chest pain.  You have chronic lung disease along with any of the following: ? Wheezing. ? Prolonged cough. ? Coughing up blood. ? A change in your usual mucus.  You have a stiff neck.  You have changes in your: ? Vision. ? Hearing. ? Thinking. ? Mood. Summary  An upper respiratory infection (URI) is a common infection of the nose, throat, and upper air passages that lead to the lungs.  A URI is caused by a virus.  URIs usually get better on their own within 7-10 days.  Medicines cannot cure URIs, but your health care provider may recommend certain medicines to help relieve symptoms. This information is not intended to replace advice given to you by your health care provider. Make sure you discuss any questions you have with your health care provider. Document Revised: 12/05/2018 Document Reviewed: 07/13/2017 Elsevier Patient Education  2020 Else[employer] Inc.    

## 2020-10-12 LAB — SARS-COV-2, NAA 2 DAY TAT

## 2020-10-12 LAB — NOVEL CORONAVIRUS, NAA: SARS-CoV-2, NAA: NOT DETECTED

## 2020-11-18 ENCOUNTER — Encounter: Payer: BC Managed Care – PPO | Admitting: Family

## 2020-11-18 DIAGNOSIS — J0100 Acute maxillary sinusitis, unspecified: Secondary | ICD-10-CM | POA: Diagnosis not present

## 2020-11-18 DIAGNOSIS — R059 Cough, unspecified: Secondary | ICD-10-CM | POA: Diagnosis not present

## 2020-11-18 DIAGNOSIS — R509 Fever, unspecified: Secondary | ICD-10-CM | POA: Diagnosis not present

## 2020-11-18 DIAGNOSIS — J029 Acute pharyngitis, unspecified: Secondary | ICD-10-CM | POA: Diagnosis not present

## 2020-11-24 ENCOUNTER — Encounter: Payer: BC Managed Care – PPO | Admitting: Family

## 2020-11-25 ENCOUNTER — Encounter: Payer: BC Managed Care – PPO | Admitting: Family

## 2020-12-23 DIAGNOSIS — M25551 Pain in right hip: Secondary | ICD-10-CM | POA: Diagnosis not present

## 2020-12-23 DIAGNOSIS — K219 Gastro-esophageal reflux disease without esophagitis: Secondary | ICD-10-CM | POA: Diagnosis not present

## 2020-12-23 DIAGNOSIS — E663 Overweight: Secondary | ICD-10-CM | POA: Diagnosis not present

## 2020-12-23 DIAGNOSIS — Z9189 Other specified personal risk factors, not elsewhere classified: Secondary | ICD-10-CM | POA: Diagnosis not present

## 2020-12-23 DIAGNOSIS — Z08 Encounter for follow-up examination after completed treatment for malignant neoplasm: Secondary | ICD-10-CM | POA: Diagnosis not present

## 2020-12-23 DIAGNOSIS — C50911 Malignant neoplasm of unspecified site of right female breast: Secondary | ICD-10-CM | POA: Diagnosis not present

## 2020-12-23 DIAGNOSIS — Z79899 Other long term (current) drug therapy: Secondary | ICD-10-CM | POA: Diagnosis not present

## 2020-12-23 DIAGNOSIS — Z9011 Acquired absence of right breast and nipple: Secondary | ICD-10-CM | POA: Diagnosis not present

## 2020-12-23 DIAGNOSIS — Z6829 Body mass index (BMI) 29.0-29.9, adult: Secondary | ICD-10-CM | POA: Diagnosis not present

## 2020-12-23 DIAGNOSIS — M25552 Pain in left hip: Secondary | ICD-10-CM | POA: Diagnosis not present

## 2020-12-23 DIAGNOSIS — Z853 Personal history of malignant neoplasm of breast: Secondary | ICD-10-CM | POA: Diagnosis not present

## 2020-12-23 DIAGNOSIS — I10 Essential (primary) hypertension: Secondary | ICD-10-CM | POA: Diagnosis not present

## 2020-12-23 DIAGNOSIS — L400 Psoriasis vulgaris: Secondary | ICD-10-CM | POA: Diagnosis not present

## 2021-01-13 ENCOUNTER — Ambulatory Visit: Payer: BC Managed Care – PPO | Admitting: Family

## 2021-01-31 ENCOUNTER — Ambulatory Visit: Payer: BC Managed Care – PPO | Admitting: Family

## 2021-02-13 ENCOUNTER — Other Ambulatory Visit: Payer: Self-pay | Admitting: Family

## 2021-02-19 ENCOUNTER — Other Ambulatory Visit: Payer: Self-pay | Admitting: Family

## 2021-02-24 ENCOUNTER — Other Ambulatory Visit: Payer: Self-pay

## 2021-02-24 ENCOUNTER — Ambulatory Visit: Payer: BC Managed Care – PPO | Admitting: Family

## 2021-02-24 ENCOUNTER — Encounter: Payer: Self-pay | Admitting: Family

## 2021-02-24 VITALS — BP 130/82 | HR 62 | Temp 97.0°F | Ht 62.5 in | Wt 182.0 lb

## 2021-02-24 DIAGNOSIS — Z23 Encounter for immunization: Secondary | ICD-10-CM

## 2021-02-24 DIAGNOSIS — I10 Essential (primary) hypertension: Secondary | ICD-10-CM | POA: Diagnosis not present

## 2021-02-24 DIAGNOSIS — E559 Vitamin D deficiency, unspecified: Secondary | ICD-10-CM

## 2021-02-24 DIAGNOSIS — E782 Mixed hyperlipidemia: Secondary | ICD-10-CM

## 2021-02-24 DIAGNOSIS — Z0000 Encounter for general adult medical examination without abnormal findings: Secondary | ICD-10-CM

## 2021-02-24 DIAGNOSIS — Z170 Estrogen receptor positive status [ER+]: Secondary | ICD-10-CM

## 2021-02-24 DIAGNOSIS — Z0001 Encounter for general adult medical examination with abnormal findings: Secondary | ICD-10-CM

## 2021-02-24 DIAGNOSIS — K219 Gastro-esophageal reflux disease without esophagitis: Secondary | ICD-10-CM | POA: Diagnosis not present

## 2021-02-24 DIAGNOSIS — C50411 Malignant neoplasm of upper-outer quadrant of right female breast: Secondary | ICD-10-CM

## 2021-02-24 DIAGNOSIS — K580 Irritable bowel syndrome with diarrhea: Secondary | ICD-10-CM

## 2021-02-24 MED ORDER — DEXLANSOPRAZOLE 60 MG PO CPDR: 60.0000 mg | DELAYED_RELEASE_CAPSULE | Freq: Every day | ORAL | 2 refills | Status: DC

## 2021-02-24 MED ORDER — VITAMIN D (ERGOCALCIFEROL) 1.25 MG (50000 UNIT) PO CAPS: 50000.0000 [IU] | ORAL_CAPSULE | ORAL | 3 refills | Status: DC

## 2021-02-24 NOTE — Progress Notes (Signed)
Subjective:    Patient ID: Grace Gomez, female    DOB: 10-16-1966, 55 y.o.   MRN: [id number]  Chief Complaint  Patient presents with  . Annual Exam   PT presents to the office today for CPE without pap. PT has hx  right breast cancer and is followed by Oncologists every 4 months. Completed XRT.   She is followed by GI every 6 months for GERD and IBS. She started taking Imodium daily and has greatly improved her diarrhea.  Hypertension This is a chronic problem. The current episode started more than 1 year ago. The problem has been resolved since onset. The problem is controlled. Pertinent negatives include no malaise/fatigue, peripheral edema or shortness of breath. Risk factors for coronary artery disease include dyslipidemia, obesity and sedentary lifestyle. The current treatment provides moderate improvement.  Gastroesophageal Reflux She complains of belching and heartburn. This is a chronic problem. The current episode started more than 1 year ago. The problem occurs occasionally. The problem has been waxing and waning. She has tried a PPI for the symptoms. The treatment provided moderate relief.  Diarrhea  This is a chronic problem. The problem occurs less than 2 times per day.  Hyperlipidemia This is a chronic problem. The current episode started more than 1 year ago. Exacerbating diseases include obesity. Pertinent negatives include no shortness of breath. Current antihyperlipidemic treatment includes statins. The current treatment provides moderate improvement of lipids. Risk factors for coronary artery disease include dyslipidemia, hypertension and a sedentary lifestyle.      Review of Systems  Constitutional: Negative for malaise/fatigue.  Respiratory: Negative for shortness of breath.   Gastrointestinal: Positive for diarrhea and heartburn.  All other systems reviewed and are negative.  Family History  Problem Relation Age of Onset  . Heart disease Mother 69  .  Hypertension Mother   . Cancer Father   . Heart disease Brother   . Heart disease Maternal Aunt 46  . Colon cancer Maternal Grandmother   . Crohn's disease Cousin   . Irritable bowel syndrome Daughter    Social History   Socioeconomic History  . Marital status: Single    Spouse name: Not on file  . Number of children: 3  . Years of education: Not on file  . Highest education level: Not on file  Occupational History  . Occupation: Single Chief Financial Officer  Tobacco Use  . Smoking status: Former Smoker    Packs/day: 1.00    Types: Cigarettes    Start date: 07/06/1980    Quit date: 08/20/2001    Years since quitting: 19.5  . Smokeless tobacco: Never Used  Vaping Use  . Vaping Use: Never used  Substance and Sexual Activity  . Alcohol use: No  . Drug use: No  . Sexual activity: Not on file  Other Topics Concern  . Not on file  Social History Narrative  . Not on file   Social Determinants of Health   Financial Resource Strain: Not on file  Food Insecurity: Not on file  Transportation Needs: Not on file  Physical Activity: Not on file  Stress: Not on file  Social Connections: Not on file        Objective:   Physical Exam Vitals reviewed.  Constitutional:      General: She is not in acute distress.    Appearance: She is well-developed. She is obese.  HENT:     Head: Normocephalic and atraumatic.     Right Ear: Tympanic membrane normal.  Left Ear: Tympanic membrane normal.  Eyes:     Pupils: Pupils are equal, round, and reactive to light.  Neck:     Thyroid: No thyromegaly.  Cardiovascular:     Rate and Rhythm: Normal rate and regular rhythm.     Heart sounds: Normal heart sounds. No murmur heard.   Pulmonary:     Effort: Pulmonary effort is normal. No respiratory distress.     Breath sounds: Normal breath sounds. No wheezing.  Abdominal:     General: Bowel sounds are normal. There is no distension.     Palpations: Abdomen is soft.     Tenderness:  There is no abdominal tenderness.  Musculoskeletal:        General: No tenderness. Normal range of motion.     Cervical back: Normal range of motion and neck supple.  Skin:    General: Skin is warm and dry.  Neurological:     Mental Status: She is alert and oriented to person, place, and time.     Cranial Nerves: No cranial nerve deficit.     Deep Tendon Reflexes: Reflexes are normal and symmetric.  Psychiatric:        Behavior: Behavior normal.        Thought Content: Thought content normal.        Judgment: Judgment normal.       BP (!) 150/84   Pulse 62   Temp (!) 97 F (36.1 C) (Temporal)   Ht 5' 2.5" (1.588 m)   Wt 182 lb (82.6 kg)   LMP 01/18/2018   BMI 32.76 kg/m      Assessment & Plan:  Grace Gomez comes in today with chief complaint of Annual Exam   Diagnosis and orders addressed:  1. Need for immunization against influenza - Flu Vaccine QUAD 39moIM (Fluarix, Fluzone & Alfiuria Quad PF) - CMP14+EGFR - CBC with Differential/Platelet  2. Essential hypertension - CMP14+EGFR - CBC with Differential/Platelet  3. Gastroesophageal reflux disease, unspecified whether esophagitis present - dexlansoprazole (DEXILANT) 60 MG capsule; Take 1 capsule (60 mg total) by mouth daily.  Dispense: 30 capsule; Refill: 2 - CMP14+EGFR - CBC with Differential/Platelet  4. Vitamin D deficiency - Vitamin D, Ergocalciferol, (DRISDOL) 1.25 MG (50000 UNIT) CAPS capsule; Take 1 capsule (50,000 Units total) by mouth every 7 (seven) days.  Dispense: 12 capsule; Refill: 3 - CMP14+EGFR - CBC with Differential/Platelet - VITAMIN D 25 Hydroxy (Vit-D Deficiency, Fractures)  5. Mixed hyperlipidemia - CMP14+EGFR - CBC with Differential/Platelet - Lipid panel  6. Annual physical exam - CMP14+EGFR - CBC with Differential/Platelet - Lipid panel - TSH - VITAMIN D 25 Hydroxy (Vit-D Deficiency, Fractures)  7. Irritable bowel syndrome with diarrhea - CMP14+EGFR - CBC with  Differential/Platelet  8. Malignant neoplasm of upper-outer quadrant of right breast in female, estrogen receptor positive ([place] - CMP14+EGFR - CBC with Differential/Platelet   Labs pending Health Maintenance reviewed Diet and exercise encouraged  Follow up plan: 6 months    CEvelina Dun FNP

## 2021-02-24 NOTE — Patient Instructions (Signed)
Health Maintenance, Female Adopting a healthy lifestyle and getting preventive care are important in promoting health and wellness. Ask your health care provider about:  The right schedule for you to have regular tests and exams.  Things you can do on your own to prevent diseases and keep yourself healthy. What should I know about diet, weight, and exercise? Eat a healthy diet  Eat a diet that includes plenty of vegetables, fruits, low-fat dairy products, and lean protein.  Do not eat a lot of foods that are high in solid fats, added sugars, or sodium.   Maintain a healthy weight Body mass index (BMI) is used to identify weight problems. It estimates body fat based on height and weight. Your health care provider can help determine your BMI and help you achieve or maintain a healthy weight. Get regular exercise Get regular exercise. This is one of the most important things you can do for your health. Most adults should:  Exercise for at least 150 minutes each week. The exercise should increase your heart rate and make you sweat (moderate-intensity exercise).  Do strengthening exercises at least twice a week. This is in addition to the moderate-intensity exercise.  Spend less time sitting. Even light physical activity can be beneficial. Watch cholesterol and blood lipids Have your blood tested for lipids and cholesterol at 57 years of age, then have this test every 5 years. Have your cholesterol levels checked more often if:  Your lipid or cholesterol levels are high.  You are older than 57 years of age.  You are at high risk for heart disease. What should I know about cancer screening? Depending on your health history and family history, you may need to have cancer screening at various ages. This may include screening for:  Breast cancer.  Cervical cancer.  Colorectal cancer.  Skin cancer.  Lung cancer. What should I know about heart disease, diabetes, and high blood  pressure? Blood pressure and heart disease  High blood pressure causes heart disease and increases the risk of stroke. This is more likely to develop in people who have high blood pressure readings, are of African descent, or are overweight.  Have your blood pressure checked: ? Every 3-5 years if you are 18-39 years of age. ? Every year if you are 40 years old or older. Diabetes Have regular diabetes screenings. This checks your fasting blood sugar level. Have the screening done:  Once every three years after age 40 if you are at a normal weight and have a low risk for diabetes.  More often and at a younger age if you are overweight or have a high risk for diabetes. What should I know about preventing infection? Hepatitis B If you have a higher risk for hepatitis B, you should be screened for this virus. Talk with your health care provider to find out if you are at risk for hepatitis B infection. Hepatitis C Testing is recommended for:  Everyone born from 1945 through 1965.  Anyone with known risk factors for hepatitis C. Sexually transmitted infections (STIs)  Get screened for STIs, including gonorrhea and chlamydia, if: ? You are sexually active and are younger than 57 years of age. ? You are older than 57 years of age and your health care provider tells you that you are at risk for this type of infection. ? Your sexual activity has changed since you were last screened, and you are at increased risk for chlamydia or gonorrhea. Ask your health care provider   if you are at risk.  Ask your health care provider about whether you are at high risk for HIV. Your health care provider may recommend a prescription medicine to help prevent HIV infection. If you choose to take medicine to prevent HIV, you should first get tested for HIV. You should then be tested every 3 months for as long as you are taking the medicine. Pregnancy  If you are about to stop having your period (premenopausal) and  you may become pregnant, seek counseling before you get pregnant.  Take 400 to 800 micrograms (mcg) of folic acid every day if you become pregnant.  Ask for birth control (contraception) if you want to prevent pregnancy. Osteoporosis and menopause Osteoporosis is a disease in which the bones lose minerals and strength with aging. This can result in bone fractures. If you are 65 years old or older, or if you are at risk for osteoporosis and fractures, ask your health care provider if you should:  Be screened for bone loss.  Take a calcium or vitamin D supplement to lower your risk of fractures.  Be given hormone replacement therapy (HRT) to treat symptoms of menopause. Follow these instructions at home: Lifestyle  Do not use any products that contain nicotine or tobacco, such as cigarettes, e-cigarettes, and chewing tobacco. If you need help quitting, ask your health care provider.  Do not use street drugs.  Do not share needles.  Ask your health care provider for help if you need support or information about quitting drugs. Alcohol use  Do not drink alcohol if: ? Your health care provider tells you not to drink. ? You are pregnant, may be pregnant, or are planning to become pregnant.  If you drink alcohol: ? Limit how much you use to 0-1 drink a day. ? Limit intake if you are breastfeeding.  Be aware of how much alcohol is in your drink. In the [place] one drink equals one 12 oz bottle of beer (355 mL), one 5 oz glass of wine (148 mL), or one 1 oz glass of hard liquor (44 mL). General instructions  Schedule regular health, dental, and eye exams.  Stay current with your vaccines.  Tell your health care provider if: ? You often feel depressed. ? You have ever been abused or do not feel safe at home. Summary  Adopting a healthy lifestyle and getting preventive care are important in promoting health and wellness.  Follow your health care provider's instructions about healthy  diet, exercising, and getting tested or screened for diseases.  Follow your health care provider's instructions on monitoring your cholesterol and blood pressure. This information is not intended to replace advice given to you by your health care provider. Make sure you discuss any questions you have with your health care provider. Document Revised: 11/20/2018 Document Reviewed: 11/20/2018 Elsevier Patient Education  2021 Elsevier Inc.  

## 2021-02-25 LAB — CMP14+EGFR
ALT: 13 IU/L (ref 0–32)
AST: 18 IU/L (ref 0–40)
Albumin/Globulin Ratio: 1.6 (ref 1.2–2.2)
Albumin: 4.3 g/dL (ref 3.8–4.9)
Alkaline Phosphatase: 126 IU/L — ABNORMAL HIGH (ref 44–121)
BUN/Creatinine Ratio: 24 — ABNORMAL HIGH (ref 9–23)
BUN: 18 mg/dL (ref 6–24)
Bilirubin Total: 0.3 mg/dL (ref 0.0–1.2)
CO2: 24 mmol/L (ref 20–29)
Calcium: 9.9 mg/dL (ref 8.7–10.2)
Chloride: 102 mmol/L (ref 96–106)
Creatinine, Ser: 0.74 mg/dL (ref 0.57–1.00)
Globulin, Total: 2.7 g/dL (ref 1.5–4.5)
Glucose: 98 mg/dL (ref 65–99)
Potassium: 4.9 mmol/L (ref 3.5–5.2)
Sodium: 140 mmol/L (ref 134–144)
Total Protein: 7 g/dL (ref 6.0–8.5)
eGFR: 95 mL/min/{1.73_m2} (ref >59)

## 2021-02-25 LAB — CBC WITH DIFFERENTIAL/PLATELET
Basophils Absolute: 0.1 10*3/uL (ref 0.0–0.2)
Basos: 1 %
EOS (ABSOLUTE): 0.1 10*3/uL (ref 0.0–0.4)
Eos: 2 %
Hematocrit: 41.9 % (ref 34.0–46.6)
Hemoglobin: 14.1 g/dL (ref 11.1–15.9)
Immature Grans (Abs): 0 10*3/uL (ref 0.0–0.1)
Immature Granulocytes: 0 %
Lymphocytes Absolute: 1.8 10*3/uL (ref 0.7–3.1)
Lymphs: 27 %
MCH: 29.9 pg (ref 26.6–33.0)
MCHC: 33.7 g/dL (ref 31.5–35.7)
MCV: 89 fL (ref 79–97)
Monocytes Absolute: 0.4 10*3/uL (ref 0.1–0.9)
Monocytes: 6 %
Neutrophils Absolute: 4.4 10*3/uL (ref 1.4–7.0)
Neutrophils: 64 %
Platelets: 259 10*3/uL (ref 150–450)
RBC: 4.72 x10E6/uL (ref 3.77–5.28)
RDW: 12.7 % (ref 11.7–15.4)
WBC: 6.7 10*3/uL (ref 3.4–10.8)

## 2021-02-25 LAB — LIPID PANEL
Chol/HDL Ratio: 5.1 ratio — ABNORMAL HIGH (ref 0.0–4.4)
Cholesterol, Total: 250 mg/dL — ABNORMAL HIGH (ref 100–199)
HDL: 49 mg/dL (ref >39)
LDL Chol Calc (NIH): 167 mg/dL — ABNORMAL HIGH (ref 0–99)
Triglycerides: 186 mg/dL — ABNORMAL HIGH (ref 0–149)
VLDL Cholesterol Cal: 34 mg/dL (ref 5–40)

## 2021-02-25 LAB — TSH: TSH: 1.56 u[IU]/mL (ref 0.450–4.500)

## 2021-02-25 LAB — VITAMIN D 25 HYDROXY (VIT D DEFICIENCY, FRACTURES): Vit D, 25-Hydroxy: 35.5 ng/mL (ref 30.0–100.0)

## 2021-04-04 DIAGNOSIS — H04123 Dry eye syndrome of bilateral lacrimal glands: Secondary | ICD-10-CM | POA: Diagnosis not present

## 2021-04-04 DIAGNOSIS — H40033 Anatomical narrow angle, bilateral: Secondary | ICD-10-CM | POA: Diagnosis not present

## 2021-04-21 DIAGNOSIS — E559 Vitamin D deficiency, unspecified: Secondary | ICD-10-CM | POA: Diagnosis not present

## 2021-04-21 DIAGNOSIS — C50911 Malignant neoplasm of unspecified site of right female breast: Secondary | ICD-10-CM | POA: Diagnosis not present

## 2021-04-21 DIAGNOSIS — I10 Essential (primary) hypertension: Secondary | ICD-10-CM | POA: Diagnosis not present

## 2021-05-20 DIAGNOSIS — H04123 Dry eye syndrome of bilateral lacrimal glands: Secondary | ICD-10-CM | POA: Diagnosis not present

## 2021-08-08 ENCOUNTER — Other Ambulatory Visit: Payer: Self-pay | Admitting: Family

## 2021-08-08 DIAGNOSIS — K219 Gastro-esophageal reflux disease without esophagitis: Secondary | ICD-10-CM

## 2021-08-23 DIAGNOSIS — R21 Rash and other nonspecific skin eruption: Secondary | ICD-10-CM | POA: Diagnosis not present

## 2021-08-23 DIAGNOSIS — E559 Vitamin D deficiency, unspecified: Secondary | ICD-10-CM | POA: Diagnosis not present

## 2021-08-23 DIAGNOSIS — I10 Essential (primary) hypertension: Secondary | ICD-10-CM | POA: Diagnosis not present

## 2021-08-23 DIAGNOSIS — C50911 Malignant neoplasm of unspecified site of right female breast: Secondary | ICD-10-CM | POA: Diagnosis not present

## 2021-08-23 DIAGNOSIS — Z9189 Other specified personal risk factors, not elsewhere classified: Secondary | ICD-10-CM | POA: Diagnosis not present

## 2021-09-16 ENCOUNTER — Other Ambulatory Visit: Payer: Self-pay | Admitting: Family

## 2021-09-16 DIAGNOSIS — K219 Gastro-esophageal reflux disease without esophagitis: Secondary | ICD-10-CM

## 2021-09-27 DIAGNOSIS — C50911 Malignant neoplasm of unspecified site of right female breast: Secondary | ICD-10-CM | POA: Diagnosis not present

## 2021-09-27 DIAGNOSIS — Z1231 Encounter for screening mammogram for malignant neoplasm of breast: Secondary | ICD-10-CM | POA: Diagnosis not present

## 2021-09-27 DIAGNOSIS — R922 Inconclusive mammogram: Secondary | ICD-10-CM | POA: Diagnosis not present

## 2021-10-25 ENCOUNTER — Encounter: Payer: BC Managed Care – PPO | Admitting: Family

## 2021-11-01 ENCOUNTER — Other Ambulatory Visit: Payer: Self-pay

## 2021-11-01 ENCOUNTER — Encounter: Payer: Self-pay | Admitting: Family

## 2021-11-01 ENCOUNTER — Ambulatory Visit (INDEPENDENT_AMBULATORY_CARE_PROVIDER_SITE_OTHER): Payer: BC Managed Care – PPO | Admitting: Family

## 2021-11-01 VITALS — BP 127/80 | HR 68 | Temp 96.2°F | Ht 62.5 in | Wt 183.0 lb

## 2021-11-01 DIAGNOSIS — Z0000 Encounter for general adult medical examination without abnormal findings: Secondary | ICD-10-CM | POA: Diagnosis not present

## 2021-11-01 DIAGNOSIS — E559 Vitamin D deficiency, unspecified: Secondary | ICD-10-CM

## 2021-11-01 DIAGNOSIS — Z0001 Encounter for general adult medical examination with abnormal findings: Secondary | ICD-10-CM | POA: Diagnosis not present

## 2021-11-01 DIAGNOSIS — K219 Gastro-esophageal reflux disease without esophagitis: Secondary | ICD-10-CM

## 2021-11-01 DIAGNOSIS — K580 Irritable bowel syndrome with diarrhea: Secondary | ICD-10-CM

## 2021-11-01 DIAGNOSIS — I10 Essential (primary) hypertension: Secondary | ICD-10-CM

## 2021-11-01 DIAGNOSIS — Z853 Personal history of malignant neoplasm of breast: Secondary | ICD-10-CM

## 2021-11-01 MED ORDER — PANTOPRAZOLE SODIUM 40 MG PO TBEC: 40.0000 mg | DELAYED_RELEASE_TABLET | Freq: Every day | ORAL | 3 refills | Status: DC

## 2021-11-01 NOTE — Progress Notes (Signed)
Subjective:    Patient ID: Grace Gomez, female    DOB: July 13, 1966, 55 y.o.   MRN: [id number]  Chief Complaint  Patient presents with   Annual Exam   PT presents to the office today for CPE without pap. PT has hx  right breast cancer and is followed by Oncologists every 6 months. Completed XRT.    She is followed by GI as needed for GERD and IBS. She started taking Imodium daily and has greatly improved her diarrhea.  Hypertension This is a chronic problem. The current episode started more than 1 year ago. The problem has been waxing and waning since onset. The problem is uncontrolled. Pertinent negatives include no malaise/fatigue, peripheral edema or shortness of breath. Risk factors for coronary artery disease include dyslipidemia and sedentary lifestyle. The current treatment provides moderate improvement.  Gastroesophageal Reflux She complains of belching and heartburn. This is a chronic problem. The current episode started more than 1 year ago. The problem occurs occasionally. Risk factors include obesity. She has tried a PPI for the symptoms. The treatment provided moderate relief.     Review of Systems  Constitutional:  Negative for malaise/fatigue.  Respiratory:  Negative for shortness of breath.   Gastrointestinal:  Positive for heartburn.  All other systems reviewed and are negative.     Objective:   Physical Exam Vitals reviewed.  Constitutional:      General: She is not in acute distress.    Appearance: She is well-developed. She is obese.  HENT:     Head: Normocephalic and atraumatic.     Right Ear: Tympanic membrane normal.     Left Ear: Tympanic membrane normal.  Eyes:     Pupils: Pupils are equal, round, and reactive to light.  Neck:     Thyroid: No thyromegaly.  Cardiovascular:     Rate and Rhythm: Normal rate and regular rhythm.     Heart sounds: Normal heart sounds. No murmur heard. Pulmonary:     Effort: Pulmonary effort is normal. No respiratory  distress.     Breath sounds: Normal breath sounds. No wheezing.  Abdominal:     General: Bowel sounds are normal. There is no distension.     Palpations: Abdomen is soft.     Tenderness: There is no abdominal tenderness.  Musculoskeletal:        General: No tenderness. Normal range of motion.     Cervical back: Normal range of motion and neck supple.  Skin:    General: Skin is warm and dry.  Neurological:     Mental Status: She is alert and oriented to person, place, and time.     Cranial Nerves: No cranial nerve deficit.     Deep Tendon Reflexes: Reflexes are normal and symmetric.  Psychiatric:        Behavior: Behavior normal.        Thought Content: Thought content normal.        Judgment: Judgment normal.         BP 127/80 Comment: patient reports at home  Pulse 68   Temp (!) 96.2 F (35.7 C) (Temporal)   Ht 5' 2.5" (1.588 m)   Wt 183 lb (83 kg)   LMP 01/18/2018   BMI 32.94 kg/m   Assessment & Plan:  [name] comes in today with chief complaint of Annual Exam   Diagnosis and orders addressed:  1. Annual physical exam  - CMP14+EGFR - CBC with Differential/Platelet - Lipid panel - TSH - VITAMIN  D 25 Hydroxy (Vit-D Deficiency, Fractures)  2. Essential hypertension - CMP14+EGFR - CBC with Differential/Platelet  3. Irritable bowel syndrome with diarrhea - CMP14+EGFR - CBC with Differential/Platelet  4. Gastroesophageal reflux disease, unspecified whether esophagitis present Will stop dexilant 60 mg and start Protonix because of price She will call and let me know if she would like to switch back to dexilant  - CMP14+EGFR - CBC with Differential/Platelet - pantoprazole (PROTONIX) 40 MG tablet; Take 1 tablet (40 mg total) by mouth daily.  Dispense: 90 tablet; Refill: 3  5. Vitamin D deficiency - CMP14+EGFR - CBC with Differential/Platelet - VITAMIN D 25 Hydroxy (Vit-D Deficiency, Fractures)  6. History of right breast cancer - CMP14+EGFR - CBC  with Differential/Platelet   Labs pending Health Maintenance reviewed Diet and exercise encouraged  Follow up plan: 1 year    Christy Hawks, FNP    

## 2021-11-01 NOTE — Patient Instructions (Signed)

## 2021-11-02 LAB — CMP14+EGFR
ALT: 13 IU/L (ref 0–32)
AST: 15 IU/L (ref 0–40)
Albumin/Globulin Ratio: 1.5 (ref 1.2–2.2)
Albumin: 4.2 g/dL (ref 3.8–4.9)
Alkaline Phosphatase: 120 IU/L (ref 44–121)
BUN/Creatinine Ratio: 25 — ABNORMAL HIGH (ref 9–23)
BUN: 18 mg/dL (ref 6–24)
Bilirubin Total: 0.2 mg/dL (ref 0.0–1.2)
CO2: 25 mmol/L (ref 20–29)
Calcium: 10 mg/dL (ref 8.7–10.2)
Chloride: 101 mmol/L (ref 96–106)
Creatinine, Ser: 0.73 mg/dL (ref 0.57–1.00)
Globulin, Total: 2.8 g/dL (ref 1.5–4.5)
Glucose: 91 mg/dL (ref 70–99)
Potassium: 5 mmol/L (ref 3.5–5.2)
Sodium: 138 mmol/L (ref 134–144)
Total Protein: 7 g/dL (ref 6.0–8.5)
eGFR: 96 mL/min/{1.73_m2} (ref >59)

## 2021-11-02 LAB — LIPID PANEL
Chol/HDL Ratio: 5.9 ratio — ABNORMAL HIGH (ref 0.0–4.4)
Cholesterol, Total: 247 mg/dL — ABNORMAL HIGH (ref 100–199)
HDL: 42 mg/dL (ref >39)
LDL Chol Calc (NIH): 132 mg/dL — ABNORMAL HIGH (ref 0–99)
Triglycerides: 407 mg/dL — ABNORMAL HIGH (ref 0–149)
VLDL Cholesterol Cal: 73 mg/dL — ABNORMAL HIGH (ref 5–40)

## 2021-11-02 LAB — CBC WITH DIFFERENTIAL/PLATELET
Basophils Absolute: 0.1 10*3/uL (ref 0.0–0.2)
Basos: 1 %
EOS (ABSOLUTE): 0.1 10*3/uL (ref 0.0–0.4)
Eos: 2 %
Hematocrit: 45 % (ref 34.0–46.6)
Hemoglobin: 14.9 g/dL (ref 11.1–15.9)
Immature Grans (Abs): 0 10*3/uL (ref 0.0–0.1)
Immature Granulocytes: 0 %
Lymphocytes Absolute: 2.1 10*3/uL (ref 0.7–3.1)
Lymphs: 29 %
MCH: 29.4 pg (ref 26.6–33.0)
MCHC: 33.1 g/dL (ref 31.5–35.7)
MCV: 89 fL (ref 79–97)
Monocytes Absolute: 0.5 10*3/uL (ref 0.1–0.9)
Monocytes: 6 %
Neutrophils Absolute: 4.4 10*3/uL (ref 1.4–7.0)
Neutrophils: 62 %
Platelets: 279 10*3/uL (ref 150–450)
RBC: 5.06 x10E6/uL (ref 3.77–5.28)
RDW: 12.8 % (ref 11.7–15.4)
WBC: 7.2 10*3/uL (ref 3.4–10.8)

## 2021-11-02 LAB — TSH: TSH: 2.02 u[IU]/mL (ref 0.450–4.500)

## 2021-11-02 LAB — VITAMIN D 25 HYDROXY (VIT D DEFICIENCY, FRACTURES): Vit D, 25-Hydroxy: 31 ng/mL (ref 30.0–100.0)

## 2021-11-07 ENCOUNTER — Telehealth: Payer: Self-pay | Admitting: Family

## 2021-11-07 DIAGNOSIS — E559 Vitamin D deficiency, unspecified: Secondary | ICD-10-CM

## 2021-11-08 MED ORDER — DEXLANSOPRAZOLE 60 MG PO CPDR: 60.0000 mg | DELAYED_RELEASE_CAPSULE | Freq: Every day | ORAL | 1 refills | Status: DC

## 2021-11-08 MED ORDER — VITAMIN D (ERGOCALCIFEROL) 1.25 MG (50000 UNIT) PO CAPS: 50000.0000 [IU] | ORAL_CAPSULE | ORAL | 3 refills | Status: DC

## 2021-11-08 NOTE — Telephone Encounter (Signed)
She has done otc vit d she does not like it she wants rx for vit d.

## 2021-11-08 NOTE — Addendum Note (Signed)
Addended by: Evelina Dun A on: 11/08/2021 10:46 AM   Modules accepted: Orders

## 2021-11-08 NOTE — Telephone Encounter (Signed)
Dexilant Prescription sent to pharmacy. She can stop prescription Vit D and take OTC Vit D. Her labs are normal.

## 2022-05-03 DIAGNOSIS — Z1231 Encounter for screening mammogram for malignant neoplasm of breast: Secondary | ICD-10-CM | POA: Diagnosis not present

## 2022-05-03 DIAGNOSIS — R21 Rash and other nonspecific skin eruption: Secondary | ICD-10-CM | POA: Diagnosis not present

## 2022-05-03 DIAGNOSIS — I10 Essential (primary) hypertension: Secondary | ICD-10-CM | POA: Diagnosis not present

## 2022-05-03 DIAGNOSIS — E559 Vitamin D deficiency, unspecified: Secondary | ICD-10-CM | POA: Diagnosis not present

## 2022-05-03 DIAGNOSIS — C50911 Malignant neoplasm of unspecified site of right female breast: Secondary | ICD-10-CM | POA: Diagnosis not present

## 2022-05-17 DIAGNOSIS — Z9049 Acquired absence of other specified parts of digestive tract: Secondary | ICD-10-CM | POA: Diagnosis not present

## 2022-05-17 DIAGNOSIS — Z20822 Contact with and (suspected) exposure to covid-19: Secondary | ICD-10-CM | POA: Diagnosis not present

## 2022-05-17 DIAGNOSIS — R110 Nausea: Secondary | ICD-10-CM | POA: Diagnosis not present

## 2022-05-17 DIAGNOSIS — R9431 Abnormal electrocardiogram [ECG] [EKG]: Secondary | ICD-10-CM | POA: Diagnosis not present

## 2022-05-17 DIAGNOSIS — K810 Acute cholecystitis: Secondary | ICD-10-CM | POA: Diagnosis not present

## 2022-05-17 DIAGNOSIS — R1011 Right upper quadrant pain: Secondary | ICD-10-CM | POA: Diagnosis not present

## 2022-05-17 DIAGNOSIS — K8012 Calculus of gallbladder with acute and chronic cholecystitis without obstruction: Secondary | ICD-10-CM | POA: Diagnosis not present

## 2022-05-17 DIAGNOSIS — K828 Other specified diseases of gallbladder: Secondary | ICD-10-CM | POA: Diagnosis not present

## 2022-05-17 DIAGNOSIS — K819 Cholecystitis, unspecified: Secondary | ICD-10-CM | POA: Diagnosis not present

## 2022-05-17 DIAGNOSIS — K7689 Other specified diseases of liver: Secondary | ICD-10-CM | POA: Diagnosis not present

## 2022-05-17 DIAGNOSIS — Z853 Personal history of malignant neoplasm of breast: Secondary | ICD-10-CM | POA: Diagnosis not present

## 2022-05-17 DIAGNOSIS — Z882 Allergy status to sulfonamides status: Secondary | ICD-10-CM | POA: Diagnosis not present

## 2022-05-17 DIAGNOSIS — K760 Fatty (change of) liver, not elsewhere classified: Secondary | ICD-10-CM | POA: Diagnosis not present

## 2022-05-17 DIAGNOSIS — K812 Acute cholecystitis with chronic cholecystitis: Secondary | ICD-10-CM | POA: Diagnosis not present

## 2022-05-17 DIAGNOSIS — K8020 Calculus of gallbladder without cholecystitis without obstruction: Secondary | ICD-10-CM | POA: Diagnosis not present

## 2022-05-17 DIAGNOSIS — Z87891 Personal history of nicotine dependence: Secondary | ICD-10-CM | POA: Diagnosis not present

## 2022-05-18 DIAGNOSIS — Z882 Allergy status to sulfonamides status: Secondary | ICD-10-CM | POA: Diagnosis not present

## 2022-05-18 DIAGNOSIS — Z87891 Personal history of nicotine dependence: Secondary | ICD-10-CM | POA: Diagnosis not present

## 2022-05-18 DIAGNOSIS — K8020 Calculus of gallbladder without cholecystitis without obstruction: Secondary | ICD-10-CM | POA: Diagnosis not present

## 2022-05-18 DIAGNOSIS — R1011 Right upper quadrant pain: Secondary | ICD-10-CM | POA: Diagnosis not present

## 2022-05-18 DIAGNOSIS — K828 Other specified diseases of gallbladder: Secondary | ICD-10-CM | POA: Diagnosis not present

## 2022-05-18 DIAGNOSIS — R110 Nausea: Secondary | ICD-10-CM | POA: Diagnosis not present

## 2022-05-18 DIAGNOSIS — Z20822 Contact with and (suspected) exposure to covid-19: Secondary | ICD-10-CM | POA: Diagnosis not present

## 2022-05-18 DIAGNOSIS — K7689 Other specified diseases of liver: Secondary | ICD-10-CM | POA: Diagnosis not present

## 2022-05-18 DIAGNOSIS — Z853 Personal history of malignant neoplasm of breast: Secondary | ICD-10-CM | POA: Diagnosis not present

## 2022-05-18 DIAGNOSIS — K8012 Calculus of gallbladder with acute and chronic cholecystitis without obstruction: Secondary | ICD-10-CM | POA: Diagnosis not present

## 2022-05-18 DIAGNOSIS — K812 Acute cholecystitis with chronic cholecystitis: Secondary | ICD-10-CM | POA: Diagnosis not present

## 2022-05-18 DIAGNOSIS — K760 Fatty (change of) liver, not elsewhere classified: Secondary | ICD-10-CM | POA: Diagnosis not present

## 2022-07-19 ENCOUNTER — Encounter: Payer: Self-pay | Admitting: Family Medicine

## 2022-07-19 ENCOUNTER — Ambulatory Visit: Payer: BC Managed Care – PPO | Admitting: Family Medicine

## 2022-07-19 VITALS — BP 140/69 | HR 86 | Temp 98.0°F | Ht 62.5 in | Wt 183.0 lb

## 2022-07-19 DIAGNOSIS — R519 Headache, unspecified: Secondary | ICD-10-CM

## 2022-07-19 DIAGNOSIS — R051 Acute cough: Secondary | ICD-10-CM | POA: Diagnosis not present

## 2022-07-19 NOTE — Progress Notes (Signed)
BP (!) 140/69   Pulse 86   Temp 98 F (36.7 C)   Ht 5' 2.5" (1.588 m)   Wt 183 lb (83 kg)   LMP 01/18/2018   SpO2 96%   BMI 32.94 kg/m    Subjective:   Patient ID: Grace Gomez, female    DOB: Sep 10, 1966, 55 y.o.   MRN: [id number]  HPI: Grace Gomez is a 56 y.o. female presenting on 07/19/2022 for Ear Pain, Cough, and Headache (Exposed to Covid 3 days ago)   HPI Cough and congestion and headache and ear pain Patient is coming in today for cough congestion headache and ear pain not going on for the past day.  She was exposed to [place] about 3 days ago by family lives in a house.  Residential elevated she denies any fevers or chills or congestion.  She is having some sinus pressure and coughing.  She says her symptoms just started this morning.  She has taken Tylenol but has not used anything else over-the-counter.  She denies any shortness of breath or wheezing.   Relevant past medical, surgical, family and social history reviewed and updated as indicated. Interim medical history since our last visit reviewed. Allergies and medications reviewed and updated.  Review of Systems  Constitutional:  Negative for chills and fever.  HENT:  Positive for congestion, postnasal drip, rhinorrhea, sinus pressure, sneezing and sore throat. Negative for ear discharge and ear pain.   Eyes:  Negative for pain, redness and visual disturbance.  Respiratory:  Positive for cough. Negative for chest tightness, shortness of breath and wheezing.   Cardiovascular:  Negative for chest pain and leg swelling.  Genitourinary:  Negative for difficulty urinating and dysuria.  Musculoskeletal:  Negative for back pain and gait problem.  Skin:  Negative for rash.  Neurological:  Negative for light-headedness and headaches.  Psychiatric/Behavioral:  Negative for agitation and behavioral problems.   All other systems reviewed and are negative.   Per HPI unless specifically indicated above   Allergies as of  07/19/2022       Reactions   Prednisone Nausea And Vomiting   Rosuvastatin Other (See Comments)   Sulfa Antibiotics Hives        Medication List        Accurate as of July 19, 2022  3:42 PM. If you have any questions, ask your nurse or doctor.          dexlansoprazole 60 MG capsule Commonly known as: Dexilant Take 1 capsule (60 mg total) by mouth daily.   Vitamin D (Ergocalciferol) 1.25 MG (50000 UNIT) Caps capsule Commonly known as: DRISDOL Take 1 capsule (50,000 Units total) by mouth every 7 (seven) days.         Objective:   BP (!) 140/69   Pulse 86   Temp 98 F (36.7 C)   Ht 5' 2.5" (1.588 m)   Wt 183 lb (83 kg)   LMP 01/18/2018   SpO2 96%   BMI 32.94 kg/m   Wt Readings from Last 3 Encounters:  07/19/22 183 lb (83 kg)  11/01/21 183 lb (83 kg)  02/24/21 182 lb (82.6 kg)    Physical Exam Vitals and nursing note reviewed.  Constitutional:      General: She is not in acute distress.    Appearance: She is well-developed. She is not diaphoretic.  HENT:     Right Ear: Tympanic membrane, ear canal and external ear normal.     Left Ear: Tympanic membrane, ear  canal and external ear normal.     Nose: Mucosal edema and rhinorrhea present.     Right Sinus: No maxillary sinus tenderness or frontal sinus tenderness.     Left Sinus: No maxillary sinus tenderness or frontal sinus tenderness.     Mouth/Throat:     Pharynx: Uvula midline. Posterior oropharyngeal erythema present. No oropharyngeal exudate.     Tonsils: No tonsillar abscesses.  Eyes:     Conjunctiva/sclera: Conjunctivae normal.  Cardiovascular:     Rate and Rhythm: Normal rate and regular rhythm.     Heart sounds: Normal heart sounds. No murmur heard. Pulmonary:     Effort: Pulmonary effort is normal. No respiratory distress.     Breath sounds: Normal breath sounds. No wheezing.  Musculoskeletal:        General: No tenderness. Normal range of motion.  Skin:    General: Skin is warm and dry.      Findings: No rash.  Neurological:     Mental Status: She is alert and oriented to person, place, and time.     Coordination: Coordination normal.  Psychiatric:        Behavior: Behavior normal.       Assessment & Plan:   Problem List Items Addressed This Visit   None Visit Diagnoses     Acute cough    -  Primary   Relevant Orders   Novel Coronavirus, NAA ([facility])   Acute nonintractable headache, unspecified headache type       Relevant Orders   Novel Coronavirus, NAA ([facility])       Recommended nasal saline washes and Mucinex and Claritin or Zyrtec or Allegra and Benadryl and manage conservatively.  Offered the patient that she could take Paxlovid but she declined right now because her symptoms are minimal.  Gave note for work. Follow up plan: Return if symptoms worsen or fail to improve.  Counseling provided for all of the vaccine components Orders Placed This Encounter  Procedures   Novel Coronavirus, NAA ([facility])    Caryl Pina, MD [facility] 07/19/2022, 3:42 PM

## 2022-07-20 LAB — NOVEL CORONAVIRUS, NAA

## 2022-07-21 ENCOUNTER — Ambulatory Visit: Payer: BC Managed Care – PPO | Admitting: Family

## 2022-07-21 ENCOUNTER — Telehealth: Payer: Self-pay | Admitting: Family

## 2022-07-21 NOTE — Telephone Encounter (Signed)
It is read as indeterminate likely because there was an error on the labs side or interaction between the test and a nasal spray or something she had taken.  I would say based on her history that she does have COVID and I would treated as such.  I would not do the prednisone and a Z-Pak right now because I can make it worse.

## 2022-07-21 NOTE — Telephone Encounter (Signed)
Patient aware and verbalized understanding. °

## 2022-07-21 NOTE — Telephone Encounter (Signed)
Lmtcb.

## 2022-07-21 NOTE — Telephone Encounter (Signed)
Patient returning call. Please call back

## 2022-08-07 ENCOUNTER — Encounter: Payer: Self-pay | Admitting: Family

## 2022-08-07 ENCOUNTER — Other Ambulatory Visit (HOSPITAL_COMMUNITY)
Admission: RE | Admit: 2022-08-07 | Discharge: 2022-08-07 | Disposition: A | Discharge status: Home / Self Care | Payer: BC Managed Care – PPO | Source: Ambulatory Visit | Attending: Family | Admitting: Family

## 2022-08-07 ENCOUNTER — Ambulatory Visit (INDEPENDENT_AMBULATORY_CARE_PROVIDER_SITE_OTHER): Payer: BC Managed Care – PPO | Admitting: Family

## 2022-08-07 VITALS — BP 129/78 | HR 74 | Temp 97.4°F | Ht 62.5 in | Wt 185.0 lb

## 2022-08-07 DIAGNOSIS — R319 Hematuria, unspecified: Secondary | ICD-10-CM

## 2022-08-07 DIAGNOSIS — N939 Abnormal uterine and vaginal bleeding, unspecified: Secondary | ICD-10-CM | POA: Insufficient documentation

## 2022-08-07 NOTE — Progress Notes (Signed)
   Subjective:    Patient ID: Grace Gomez, female    DOB: 06-11-1966, 56 y.o.   MRN: [id number]  Chief Complaint  Patient presents with   Vaginal Bleeding    LMP 4 years ago    Pt presents to the office today with intermittent vaginal bleeding that started three weeks ago. Reports she has not had a menstrual cycle in 4 years.   She has hx of breast cancer.  Vaginal Bleeding The patient's primary symptoms include vaginal bleeding. The patient's pertinent negatives include no genital itching, genital lesions, genital odor or vaginal discharge. The current episode started 1 to 4 weeks ago. The problem occurs intermittently. The patient is experiencing no pain.      Review of Systems  Genitourinary:  Positive for vaginal bleeding. Negative for vaginal discharge.  All other systems reviewed and are negative.      Objective:   Physical Exam Vitals reviewed.  Constitutional:      General: She is not in acute distress.    Appearance: She is well-developed.  HENT:     Head: Normocephalic and atraumatic.     Right Ear: Tympanic membrane normal.     Left Ear: Tympanic membrane normal.  Eyes:     Pupils: Pupils are equal, round, and reactive to light.  Neck:     Thyroid: No thyromegaly.  Cardiovascular:     Rate and Rhythm: Normal rate and regular rhythm.     Heart sounds: Normal heart sounds. No murmur heard. Pulmonary:     Effort: Pulmonary effort is normal. No respiratory distress.     Breath sounds: Normal breath sounds. No wheezing.  Abdominal:     General: Bowel sounds are normal. There is no distension.     Palpations: Abdomen is soft.     Tenderness: There is no abdominal tenderness.  Genitourinary:    Comments: Bimanual exam- no adnexal masses or tenderness, ovaries nonpalpable   Cervix parous and pink- No discharge  Musculoskeletal:        General: No tenderness. Normal range of motion.     Cervical back: Normal range of motion and neck supple.  Skin:     General: Skin is warm and dry.  Neurological:     Mental Status: She is alert and oriented to person, place, and time.     Cranial Nerves: No cranial nerve deficit.     Deep Tendon Reflexes: Reflexes are normal and symmetric.  Psychiatric:        Behavior: Behavior normal.        Thought Content: Thought content normal.        Judgment: Judgment normal.       BP 129/78   Pulse 74   Temp (!) 97.4 F (36.3 C) (Temporal)   Ht 5' 2.5" (1.588 m)   Wt 185 lb (83.9 kg)   LMP 01/18/2018   SpO2 96%   BMI 33.30 kg/m      Assessment & Plan:  Grace Gomez comes in today with chief complaint of Vaginal Bleeding (LMP 4 years ago )   Diagnosis and orders addressed:  1. Hematuria, unspecified type  2. Vagina bleeding Pap pending Referral to GYN - Cytology - PAP([place]) - Ambulatory referral to Gynecology  Evelina Dun, FNP

## 2022-08-07 NOTE — Patient Instructions (Signed)
Postmenopausal Bleeding Postmenopausal bleeding is any bleeding that a woman has after she has entered menopause. Menopause is the end of a woman's fertile years. After menopause, a woman no longer ovulates and does not have menstrual periods. Therefore, she should no longer have bleeding from her vagina. Postmenopausal bleeding may have various causes, including: Menopausal hormone therapy (MHT). Endometrial atrophy. After menopause, low estrogen hormone levels cause the membrane that lines the uterus (endometrium) to become thin. You may have bleeding as the endometrium thins. Endometrial hyperplasia. This condition is caused by excess estrogen hormones and low levels of progesterone hormones. The excess estrogen causes the endometrium to thicken, which can lead to bleeding. In some cases, this can lead to cancer of the uterus. Endometrial cancer. Noncancerous growths (polyps) on the endometrium, the lining of the uterus, or the cervix. Uterine fibroids. These are noncancerous growths in or around the uterus muscle tissue that can cause heavy bleeding. Any type of postmenopausal bleeding, even if it appears to be a typical menstrual period, should be checked by your health care provider. Treatment will depend on the cause of the bleeding. Follow these instructions at home:  Pay attention to any changes in your symptoms. Let your health care provider know about them. Avoid using tampons and douches as told by your health care provider. Change your pads regularly. Get regular pelvic exams, including Pap tests, as told by your health care provider. Take iron supplements as told by your health care provider. Take over-the-counter and prescription medicines only as told by your health care provider. Keep all follow-up visits. This is important. Contact a health care provider if: You have new bleeding from the vagina after menopause. You have pain in your abdomen. Get help right away if: You have  a fever or chills. You have severe pain with bleeding. You are passing blood clots. You have heavy bleeding, need more than 1 pad an hour, and have never experienced this before. You have headaches or feel faint or dizzy. Summary Postmenopausal bleeding is any bleeding that a woman has after she has entered into menopause. Postmenopausal bleeding may have various causes. Treatment will depend on the cause of the bleeding. Any type of postmenopausal bleeding, even if it appears to be a typical menstrual period, should be checked by your health care provider. Be sure to pay attention to any changes in your symptoms and keep all follow-up visits. This information is not intended to replace advice given to you by your health care provider. Make sure you discuss any questions you have with your health care provider. Document Revised: 05/13/2020 Document Reviewed: 05/13/2020 Elsevier Patient Education  2023 Else[employer] Inc[employer]  

## 2022-08-15 ENCOUNTER — Telehealth: Payer: Self-pay | Admitting: Family

## 2022-08-15 NOTE — Telephone Encounter (Signed)
Checking on patient aware not resulted yet on Voice  mail

## 2022-08-16 LAB — CYTOLOGY - PAP
Chlamydia: NEGATIVE
Comment: NEGATIVE
Comment: NEGATIVE
Comment: NEGATIVE
Comment: NEGATIVE
Comment: NORMAL
Diagnosis: NEGATIVE
HSV1: NEGATIVE
HSV2: NEGATIVE
High risk HPV: NEGATIVE
Neisseria Gonorrhea: NEGATIVE
Trichomonas: NEGATIVE

## 2022-09-04 DIAGNOSIS — N950 Postmenopausal bleeding: Secondary | ICD-10-CM | POA: Diagnosis not present

## 2022-09-04 DIAGNOSIS — N939 Abnormal uterine and vaginal bleeding, unspecified: Secondary | ICD-10-CM | POA: Diagnosis not present

## 2022-09-04 DIAGNOSIS — Z853 Personal history of malignant neoplasm of breast: Secondary | ICD-10-CM | POA: Diagnosis not present

## 2022-09-04 DIAGNOSIS — Z6834 Body mass index (BMI) 34.0-34.9, adult: Secondary | ICD-10-CM | POA: Diagnosis not present

## 2022-09-07 DIAGNOSIS — N950 Postmenopausal bleeding: Secondary | ICD-10-CM | POA: Diagnosis not present

## 2022-09-07 DIAGNOSIS — N854 Malposition of uterus: Secondary | ICD-10-CM | POA: Diagnosis not present

## 2022-09-07 DIAGNOSIS — D261 Other benign neoplasm of corpus uteri: Secondary | ICD-10-CM | POA: Diagnosis not present

## 2022-10-03 ENCOUNTER — Other Ambulatory Visit: Payer: Self-pay | Admitting: Family

## 2022-10-03 DIAGNOSIS — Z1231 Encounter for screening mammogram for malignant neoplasm of breast: Secondary | ICD-10-CM

## 2022-10-09 ENCOUNTER — Inpatient Hospital Stay: Admission: RE | Admit: 2022-10-09 | Payer: BC Managed Care – PPO | Source: Ambulatory Visit

## 2022-11-17 DIAGNOSIS — Z1231 Encounter for screening mammogram for malignant neoplasm of breast: Secondary | ICD-10-CM | POA: Diagnosis not present

## 2022-11-25 ENCOUNTER — Other Ambulatory Visit: Payer: Self-pay | Admitting: Family

## 2022-11-27 ENCOUNTER — Encounter: Payer: Self-pay | Admitting: Family

## 2023-01-19 DIAGNOSIS — M545 Low back pain, unspecified: Secondary | ICD-10-CM | POA: Diagnosis not present

## 2023-02-09 DIAGNOSIS — M545 Low back pain, unspecified: Secondary | ICD-10-CM | POA: Diagnosis not present

## 2023-02-23 DIAGNOSIS — M545 Low back pain, unspecified: Secondary | ICD-10-CM | POA: Diagnosis not present

## 2023-03-02 DIAGNOSIS — M545 Low back pain, unspecified: Secondary | ICD-10-CM | POA: Diagnosis not present

## 2023-07-26 ENCOUNTER — Encounter: Payer: Self-pay | Admitting: Family

## 2023-07-26 ENCOUNTER — Ambulatory Visit: Payer: BC Managed Care – PPO | Admitting: Family

## 2023-07-26 ENCOUNTER — Ambulatory Visit (INDEPENDENT_AMBULATORY_CARE_PROVIDER_SITE_OTHER): Payer: BC Managed Care – PPO | Admitting: Family

## 2023-07-26 VITALS — BP 150/80 | HR 67 | Temp 97.5°F | Ht 62.5 in | Wt 191.6 lb

## 2023-07-26 DIAGNOSIS — Z23 Encounter for immunization: Secondary | ICD-10-CM | POA: Diagnosis not present

## 2023-07-26 DIAGNOSIS — I10 Essential (primary) hypertension: Secondary | ICD-10-CM

## 2023-07-26 DIAGNOSIS — Z0000 Encounter for general adult medical examination without abnormal findings: Secondary | ICD-10-CM

## 2023-07-26 DIAGNOSIS — Z0001 Encounter for general adult medical examination with abnormal findings: Secondary | ICD-10-CM

## 2023-07-26 DIAGNOSIS — E782 Mixed hyperlipidemia: Secondary | ICD-10-CM

## 2023-07-26 DIAGNOSIS — K219 Gastro-esophageal reflux disease without esophagitis: Secondary | ICD-10-CM

## 2023-07-26 DIAGNOSIS — E559 Vitamin D deficiency, unspecified: Secondary | ICD-10-CM | POA: Diagnosis not present

## 2023-07-26 DIAGNOSIS — Z853 Personal history of malignant neoplasm of breast: Secondary | ICD-10-CM

## 2023-07-26 MED ORDER — DEXLANSOPRAZOLE 60 MG PO CPDR: 60.0000 mg | DELAYED_RELEASE_CAPSULE | Freq: Every day | ORAL | 2 refills | Status: DC

## 2023-07-26 MED ORDER — VITAMIN D (ERGOCALCIFEROL) 1.25 MG (50000 UNIT) PO CAPS: 50000.0000 [IU] | ORAL_CAPSULE | ORAL | 3 refills | Status: DC

## 2023-07-26 NOTE — Progress Notes (Signed)
Subjective:    Patient ID: Grace Gomez, female    DOB: 07-09-1966, 57 y.o.   MRN: [id number]  Chief Complaint  Patient presents with   Medical Management of Chronic Issues   PT presents to the office today for CPE without pap. PT has hx  right breast cancer and is followed by Oncologists annually. Completed XRT.    She is followed by GI as needed for GERD and IBS. She started taking Imodium with flare ups and has greatly improved her diarrhea.   She had lab work completed at work. Needs Vit D prescription refilled.  Gastroesophageal Reflux She complains of belching and heartburn. This is a chronic problem. The current episode started more than 1 year ago. The problem occurs occasionally. Risk factors include obesity. She has tried a PPI for the symptoms. The treatment provided moderate relief.  Hypertension This is a chronic problem. The current episode started more than 1 year ago. The problem has been waxing and waning since onset. The problem is uncontrolled. Pertinent negatives include no malaise/fatigue, peripheral edema or shortness of breath. Risk factors for coronary artery disease include dyslipidemia and obesity. The current treatment provides moderate improvement.  Hyperlipidemia This is a chronic problem. The current episode started more than 1 year ago. Exacerbating diseases include obesity. Pertinent negatives include no shortness of breath. Current antihyperlipidemic treatment includes diet change. The current treatment provides mild improvement of lipids. Risk factors for coronary artery disease include dyslipidemia, hypertension and a sedentary lifestyle.      Review of Systems  Constitutional:  Negative for malaise/fatigue.  Respiratory:  Negative for shortness of breath.   Gastrointestinal:  Positive for heartburn.  All other systems reviewed and are negative.  Family History  Problem Relation Age of Onset   Heart disease Mother 51   Hypertension Mother     Cancer Father    Heart disease Brother    Heart disease Maternal Aunt 29   Colon cancer Maternal Grandmother    Crohn's disease Cousin    Irritable bowel syndrome Daughter    Social History   Socioeconomic History   Marital status: Single    Spouse name: Not on file   Number of children: 3   Years of education: Not on file   Highest education level: Not on file  Occupational History   Occupation: Single Charity fundraiser  Tobacco Use   Smoking status: Former    Current packs/day: 0.00    Average packs/day: 1 pack/day for 21.1 years (21.1 ttl pk-yrs)    Types: Cigarettes    Start date: 07/06/1980    Quit date: 08/20/2001    Years since quitting: 21.9   Smokeless tobacco: Never  Vaping Use   Vaping status: Never Used  Substance and Sexual Activity   Alcohol use: No   Drug use: No   Sexual activity: Not on file  Other Topics Concern   Not on file  Social History Narrative   Not on file   Social Determinants of Health   Financial Resource Strain: Low Risk  (11/11/2019)   Received from [facility], [facility]   Overall Financial Resource Strain (CARDIA)    Difficulty of Paying Living Expenses: Not hard at all  Food Insecurity: No Food Insecurity (05/03/2022)   Received from [facility], [facility]   Hunger Vital Sign    Worried About Running Out of Food in the Last Year: Never true    Ran Out of Food in the Last Year:  Never true  Transportation Needs: Unknown (11/11/2019)   Received from [facility], [facility]   [facility] - Transportation    Lack of Transportation (Medical): Patient declined    Lack of Transportation (Non-Medical): Patient declined  Physical Activity: Unknown (11/11/2019)   Received from [facility], [facility]   Exercise Vital Sign    Days of Exercise per Week: Patient declined    Minutes of Exercise per Session: Patient declined  Stress: No Stress Concern Present (11/11/2019)   Received from [facility], [facility] - Occupational Stress Questionnaire    Feeling of Stress : Only a little  Social Connections: Unknown (04/20/2022)   Received from [facility], [facility]   Social Network    Social Network: Not on file       Objective:   Physical Exam Vitals reviewed.  Constitutional:      General: She is not in acute distress.    Appearance: She is well-developed. She is obese.  HENT:     Head: Normocephalic and atraumatic.     Right Ear: Tympanic membrane normal.     Left Ear: Tympanic membrane normal.  Eyes:     Pupils: Pupils are equal, round, and reactive to light.  Neck:     Thyroid: No thyromegaly.  Cardiovascular:     Rate and Rhythm: Normal rate and regular rhythm.     Heart sounds: Normal heart sounds. No murmur heard. Pulmonary:     Effort: Pulmonary effort is normal. No respiratory distress.     Breath sounds: Normal breath sounds. No wheezing.  Abdominal:     General: Bowel sounds are normal. There is no distension.     Palpations: Abdomen is soft.     Tenderness: There is no abdominal tenderness.  Musculoskeletal:        General: No tenderness. Normal range of motion.     Cervical back: Normal range of motion and neck supple.  Skin:    General: Skin is warm and dry.  Neurological:     Mental Status: She is alert and oriented to person, place, and time.     Cranial Nerves: No cranial nerve deficit.     Deep Tendon Reflexes: Reflexes are normal and symmetric.  Psychiatric:        Behavior: Behavior normal.        Thought Content: Thought content normal.        Judgment: Judgment normal.       BP (!) 150/80   Pulse 67   Temp (!) 97.5 F (36.4 C) (Temporal)   Ht 5' 2.5" (1.588 m)   Wt 191 lb 9.6 oz (86.9 kg)   LMP 01/18/2018   SpO2 94%   BMI 34.49 kg/m ]    Assessment & Plan:  Grace Gomez comes in today with chief complaint of Medical Management of Chronic Issues   Diagnosis and orders addressed:  1.  Vitamin D deficiency - Vitamin D, Ergocalciferol, (DRISDOL) 1.25 MG (50000 UNIT) CAPS capsule; Take 1 capsule (50,000 Units total) by mouth every 7 (seven) days.  Dispense: 12 capsule; Refill: 3  2. Annual physical exam  3. Essential hypertension  4. Gastroesophageal reflux disease, unspecified whether esophagitis present - dexlansoprazole (DEXILANT) 60 MG capsule; Take 1 capsule (60 mg total) by mouth daily.  Dispense: 90 capsule; Refill: 2  5. History of right breast cancer  6. Mixed hyperlipidemia   Labs reviewed from work,  will scan labs into chart Pt reports her BP at work was normal and she was anxious today.  She will call Oncologists and make follow up Health Maintenance reviewed Diet and exercise encouraged  Follow up plan: 1 year   Jannifer Rodney, FNP

## 2023-07-26 NOTE — Patient Instructions (Signed)
Vitamin D Deficiency Vitamin D deficiency is when your body does not have enough vitamin D. Vitamin D is important to your body because: It helps the body maintain calcium and phosphorus levels. These are important minerals. It plays a role in bone health. It reduces inflammation. It improves the body's defense system (immune system). If vitamin D deficiency is severe, it can cause a condition in which your bones become soft. In adults, this condition is called osteomalacia. In children, this condition is called rickets. What are the causes? This condition may be caused by: Not eating enough foods that contain vitamin D. Not getting enough natural sun exposure. Having certain digestive system diseases that make it difficult for your body to absorb vitamin D. These diseases include Crohn's disease, long-term (chronic) pancreatitis, and cystic fibrosis. Having had a surgery in which a part of the stomach or a part of the small intestine was removed. What increases the risk? You are more likely to develop this condition if you: Are an older adult. Do not spend much time outdoors. Live in a long-term care facility. Have dark skin. Take certain medicines, such as steroid medicines or certain seizure medicines. Are overweight or obese. Have chronic kidney or liver disease. What are the signs or symptoms? In mild cases of vitamin D deficiency, there may not be any symptoms. If the condition is severe, symptoms may include: Bone pain. Muscle pain. Not being able to walk normally (abnormal gait). Broken bones caused by a minor injury. Joint pain. How is this diagnosed? This condition may be diagnosed with blood tests. Imaging tests such as X-rays may also be done to look for changes in the bone. How is this treated? Treatment may include taking supplements as told by your health care provider. Your health care provider will tell you what dose is best for you. Supplements may include: Vitamin  D. Calcium. Follow these instructions at home: Eating and drinking Eat foods that contain vitamin D, such as: Dairy products, cereals, or juices that have vitamin D added to them (are fortified). Check the label. Fish, such as salmon or trout. Eggs. The vitamin D is in the yolk. Mushrooms that were treated with UV light. Beef liver. The items listed above may not be a complete list of foods and beverages you can eat and drink. Contact a dietitian for more information. General instructions Take over-the-counter and prescription medicines only as told by your health care provider. Take supplements only as told by your health care provider. Get regular, safe exposure to natural sunlight. Do not use a tanning bed. Maintain a healthy weight. Lose weight if needed. Keep all follow-up visits. This is important. How is this prevented? You can get vitamin D by: Eating foods that naturally contain vitamin D. Eating or drinking products that have been fortified with vitamin D, such as cereals, juices, and dairy products, including milk. Taking a vitamin D supplement or a multivitamin that contains vitamin D. Being in the sun. Your body naturally makes vitamin D when your skin is exposed to sunlight. Your body changes the sunlight into a form of the vitamin that it can use. Contact a health care provider if: Your symptoms do not go away. You feel nauseous or you vomit. You have fewer bowel movements than usual or are constipated. Summary Vitamin D deficiency is when your body does not have enough vitamin D. Vitamin D helps to keep your bones healthy. Vitamin D deficiency is primarily treated by taking supplements. Your health care  provider will suggest what dose is best for you. You can get vitamin D by eating foods that contain vitamin D, by being in the sun, and by taking a vitamin D supplement or a multivitamin that contains vitamin D. This information is not intended to replace advice given  to you by your health care provider. Make sure you discuss any questions you have with your health care provider. Document Revised: 09/02/2021 Document Reviewed: 09/02/2021 Elsevier Patient Education  2024 [employer].

## 2023-08-06 DIAGNOSIS — C50911 Malignant neoplasm of unspecified site of right female breast: Secondary | ICD-10-CM | POA: Diagnosis not present

## 2023-10-02 ENCOUNTER — Ambulatory Visit: Payer: BC Managed Care – PPO | Admitting: Nurse Practitioner

## 2023-10-02 ENCOUNTER — Encounter: Payer: Self-pay | Admitting: Nurse Practitioner

## 2023-10-02 VITALS — BP 139/72 | HR 63 | Temp 97.2°F | Ht 62.5 in | Wt 190.8 lb

## 2023-10-02 DIAGNOSIS — H6501 Acute serous otitis media, right ear: Secondary | ICD-10-CM | POA: Diagnosis not present

## 2023-10-02 MED ORDER — AMOXICILLIN 875 MG PO TABS: 875.0000 mg | ORAL_TABLET | Freq: Two times a day (BID) | ORAL | 0 refills | Status: DC

## 2023-10-02 NOTE — Progress Notes (Signed)
Established Patient Office Visit  Subjective   Patient ID: Grace Gomez, female    DOB: 03-02-1966  Age: 57 y.o. MRN: [id number]  Chief Complaint  Patient presents with   Headache    Symptoms started yesterday, thinks it is sinus infection, no other symptoms    Nasal Congestion   Ear Pain    Headache  Associated symptoms include ear pain. Pertinent negatives include no coughing, eye pain, fever, hearing loss, nausea, photophobia, tingling, vomiting or weakness.   Grace Gomez is a 1 ysr old female, seen today 10/02/23 as an acuate visit fro concerns for possible Otitis Media: Patient presents with middle ear effusion. Grace Gomez has had approximately 1 episodes of otitis media in the past 1 year. The infections typically affect the right ear and are typically manifested by congestion.  Prior antibiotic therapy has included no recent courses. Middle ear fluid has been persistent for 1 day.  The last ear infection was 1 year ago.  Her nasal symptoms consist of nasal congestion.  Patient Active Problem List   Diagnosis Date Noted   Non-recurrent acute serous otitis media of right ear 10/02/2023   History of right breast cancer 11/01/2021   IBS (irritable bowel syndrome) 04/18/2019   Vitamin D deficiency 04/21/2015   GERD (gastroesophageal reflux disease) 04/21/2015   Essential hypertension 04/21/2015   Hyperlipidemia 04/21/2015   Past Medical History:  Diagnosis Date   GERD (gastroesophageal reflux disease)    Hyperlipidemia    Hypertension    IBS (irritable bowel syndrome)    diarrhea   Past Surgical History:  Procedure Laterality Date   HERNIA REPAIR     TUBAL LIGATION  1990   Social History   Tobacco Use   Smoking status: Former    Current packs/day: 0.00    Average packs/day: 1 pack/day for 21.1 years (21.1 ttl pk-yrs)    Types: Cigarettes    Start date: 07/06/1980    Quit date: 08/20/2001    Years since quitting: 22.1   Smokeless tobacco: Never  Vaping Use   Vaping  status: Never Used  Substance Use Topics   Alcohol use: No   Drug use: No   Social History   Socioeconomic History   Marital status: Single    Spouse name: Not on file   Number of children: 3   Years of education: Not on file   Highest education level: Not on file  Occupational History   Occupation: Single Charity fundraiser  Tobacco Use   Smoking status: Former    Current packs/day: 0.00    Average packs/day: 1 pack/day for 21.1 years (21.1 ttl pk-yrs)    Types: Cigarettes    Start date: 07/06/1980    Quit date: 08/20/2001    Years since quitting: 22.1   Smokeless tobacco: Never  Vaping Use   Vaping status: Never Used  Substance and Sexual Activity   Alcohol use: No   Drug use: No   Sexual activity: Not on file  Other Topics Concern   Not on file  Social History Narrative   Not on file   Social Determinants of Health   Financial Resource Strain: Low Risk  (11/11/2019)   Received from [facility], [facility]   Overall Financial Resource Strain (CARDIA)    Difficulty of Paying Living Expenses: Not hard at all  Food Insecurity: No Food Insecurity (05/03/2022)   Received from [facility], [facility]   Hunger Vital Sign    Worried About Running Out  of Food in the Last Year: Never true    Ran Out of Food in the Last Year: Never true  Transportation Needs: Unknown (11/11/2019)   Received from [facility], [facility]   [facility] - Transportation    Lack of Transportation (Medical): Patient declined    Lack of Transportation (Non-Medical): Patient declined  Physical Activity: Unknown (11/11/2019)   Received from [facility], [facility]   Exercise Vital Sign    Days of Exercise per Week: Patient declined    Minutes of Exercise per Session: Patient declined  Stress: No Stress Concern Present (11/11/2019)   Received from [facility], [facility] - Occupational Stress Questionnaire    Feeling of  Stress : Only a little  Social Connections: Unknown (04/20/2022)   Received from [facility], [facility]   Social Network    Social Network: Not on file  Intimate Partner Violence: Not At Risk (09/07/2022)   Received from [facility], [facility]   Humiliation, Afraid, Rape, and Kick questionnaire    Fear of Current or Ex-Partner: No    Emotionally Abused: No    Physically Abused: No    Sexually Abused: No   Family Status  Relation Name Status   Mother  Deceased   Father  Deceased   Brother  Deceased   Mat Aunt  Deceased   MGM  Deceased   MGF  Deceased   PGM  Deceased   PGF  Deceased   Cousin 1st Alive   Daughter  Alive   Daughter  Alive   Daughter  Alive  No partnership data on file   Family History  Problem Relation Age of Onset   Heart disease Mother 70   Hypertension Mother    Cancer Father    Heart disease Brother    Heart disease Maternal Aunt 46   Colon cancer Maternal Grandmother    Crohn's disease Cousin    Irritable bowel syndrome Daughter    Allergies  Allergen Reactions   Prednisone Nausea And Vomiting   Rosuvastatin Other (See Comments)   Sulfa Antibiotics Hives      Review of Systems  Constitutional:  Negative for chills and fever.  HENT:  Positive for congestion and ear pain. Negative for hearing loss.   Eyes:  Negative for photophobia and pain.  Respiratory:  Negative for cough and shortness of breath.   Cardiovascular:  Negative for chest pain and leg swelling.  Gastrointestinal:  Negative for blood in stool, constipation, nausea and vomiting.  Genitourinary:  Negative for frequency, hematuria and urgency.  Musculoskeletal:  Negative for falls and myalgias.  Neurological:  Positive for headaches. Negative for tingling and weakness.  Endo/Heme/Allergies:  Negative for environmental allergies and polydipsia. Does not bruise/bleed easily.   Negative unless indicated in HPI   Objective:     BP 139/72   Pulse 63   Temp (!) 97.2  F (36.2 C) (Temporal)   Ht 5' 2.5" (1.588 m)   Wt 190 lb 12.8 oz (86.5 kg)   LMP 01/18/2018   SpO2 96%   BMI 34.34 kg/m  BP Readings from Last 3 Encounters:  10/02/23 139/72  07/26/23 (!) 150/80  08/07/22 129/78   Wt Readings from Last 3 Encounters:  10/02/23 190 lb 12.8 oz (86.5 kg)  07/26/23 191 lb 9.6 oz (86.9 kg)  08/07/22 185 lb (83.9 kg)      Physical Exam Vitals and  nursing note reviewed.  Constitutional:      Appearance: Normal appearance. She is well-developed.  HENT:     Head: Normocephalic and atraumatic.     Right Ear: Swelling and tenderness present. Tympanic membrane is erythematous.     Left Ear: Hearing and tympanic membrane normal.     Nose: Congestion present.  Eyes:     General: No scleral icterus.    Extraocular Movements: Extraocular movements intact.     Conjunctiva/sclera: Conjunctivae normal.     Pupils: Pupils are equal, round, and reactive to light.  Cardiovascular:     Rate and Rhythm: Normal rate and regular rhythm.  Pulmonary:     Effort: Pulmonary effort is normal.     Breath sounds: Normal breath sounds.  Musculoskeletal:        General: Normal range of motion.     Right lower leg: No edema.     Left lower leg: No edema.  Skin:    General: Skin is warm and dry.     Findings: No rash.  Neurological:     Mental Status: She is alert and oriented to person, place, and time. Mental status is at baseline.  Psychiatric:        Mood and Affect: Mood normal.        Behavior: Behavior normal.        Thought Content: Thought content normal.        Judgment: Judgment normal.     No results found for any visits on 10/02/23.  Last CBC Lab Results  Component Value Date   WBC 7.2 11/01/2021   HGB 14.9 11/01/2021   HCT 45.0 11/01/2021   MCV 89 11/01/2021   MCH 29.4 11/01/2021   RDW 12.8 11/01/2021   PLT 279 11/01/2021   Last metabolic panel Lab Results  Component Value Date   GLUCOSE 91 11/01/2021   NA 138 11/01/2021   K 5.0  11/01/2021   CL 101 11/01/2021   CO2 25 11/01/2021   BUN 18 11/01/2021   CREATININE 0.73 11/01/2021   GFRNONAA 101 07/05/2023   CALCIUM 10.0 11/01/2021   PROT 7.0 11/01/2021   ALBUMIN 4.2 11/01/2021   LABGLOB 2.8 11/01/2021   AGRATIO 1.5 11/01/2021   BILITOT 0.2 11/01/2021   ALKPHOS 120 11/01/2021   AST 15 11/01/2021   ALT 13 11/01/2021   Last lipids Lab Results  Component Value Date   CHOL 247 (H) 11/01/2021   HDL 42 11/01/2021   LDLCALC 132 (H) 11/01/2021   TRIG 407 (H) 11/01/2021   CHOLHDL 5.9 (H) 11/01/2021   Last hemoglobin A1c No results found for: "HGBA1C" Last thyroid functions Lab Results  Component Value Date   TSH 2.020 11/01/2021   T4TOTAL 8.1 01/05/2020        Assessment & Plan:  Non-recurrent acute serous otitis media of right ear -     Amoxicillin; Take 1 tablet (875 mg total) by mouth 2 (two) times daily.  Dispense: 10 tablet; Refill: 0   Evvie is 57 yrs old Caucasian female, no acute distress Otitis Media: Amoxicillin BID for 5 days  -work note provided   The above assessment and management plan was discussed with the patient. The patient verbalized understanding of and has agreed to the management plan. Patient is aware to call the clinic if they develop any new symptoms or if symptoms persist or worsen. Patient is aware when to return to the clinic for a follow-up visit. Patient educated on when it is appropriate  to go to the emergency department.  Return if symptoms worsen or fail to improve.    Arrie Aran Santa Lighter, DNP [facility] [address] [place], [place] [address] [phone]

## 2023-11-06 ENCOUNTER — Other Ambulatory Visit: Payer: Self-pay | Admitting: Family

## 2023-11-06 DIAGNOSIS — Z1231 Encounter for screening mammogram for malignant neoplasm of breast: Secondary | ICD-10-CM

## 2023-11-19 ENCOUNTER — Ambulatory Visit
Admission: RE | Admit: 2023-11-19 | Discharge: 2023-11-19 | Disposition: A | Discharge status: Home / Self Care | Payer: BC Managed Care – PPO | Source: Ambulatory Visit | Attending: Family | Admitting: Family

## 2023-11-19 DIAGNOSIS — Z1231 Encounter for screening mammogram for malignant neoplasm of breast: Secondary | ICD-10-CM

## 2023-11-19 HISTORY — DX: Malignant neoplasm of unspecified site of unspecified female breast: C50.919

## 2023-12-18 ENCOUNTER — Telehealth: Payer: Self-pay | Admitting: Family Medicine

## 2023-12-18 NOTE — Telephone Encounter (Signed)
 Copied from CRM [id number]. Topic: Medical Record Request - Records Request >> Dec 18, 2023  4:23 PM Alfonso ORN wrote: Reason for CRM: need mammogram results that was taking had mammogram on 11/19/23 will be having appointment with cancer doctor 01/14/2024 with [facility]  would like the Mammogram results fax over to [phone]

## 2023-12-19 NOTE — Telephone Encounter (Signed)
 Faxed report and called patient lm message to notify

## 2024-01-11 DIAGNOSIS — R42 Dizziness and giddiness: Secondary | ICD-10-CM | POA: Diagnosis not present

## 2024-01-11 DIAGNOSIS — Z20822 Contact with and (suspected) exposure to covid-19: Secondary | ICD-10-CM | POA: Diagnosis not present

## 2024-01-11 DIAGNOSIS — J439 Emphysema, unspecified: Secondary | ICD-10-CM | POA: Diagnosis not present

## 2024-01-11 DIAGNOSIS — Z87891 Personal history of nicotine dependence: Secondary | ICD-10-CM | POA: Diagnosis not present

## 2024-01-11 DIAGNOSIS — K7689 Other specified diseases of liver: Secondary | ICD-10-CM | POA: Diagnosis not present

## 2024-01-11 DIAGNOSIS — R55 Syncope and collapse: Secondary | ICD-10-CM | POA: Diagnosis not present

## 2024-01-11 DIAGNOSIS — R079 Chest pain, unspecified: Secondary | ICD-10-CM | POA: Diagnosis not present

## 2024-01-11 DIAGNOSIS — I2510 Atherosclerotic heart disease of native coronary artery without angina pectoris: Secondary | ICD-10-CM | POA: Diagnosis not present

## 2024-01-11 DIAGNOSIS — R0989 Other specified symptoms and signs involving the circulatory and respiratory systems: Secondary | ICD-10-CM | POA: Diagnosis not present

## 2024-01-11 DIAGNOSIS — R531 Weakness: Secondary | ICD-10-CM | POA: Diagnosis not present

## 2024-01-11 DIAGNOSIS — R61 Generalized hyperhidrosis: Secondary | ICD-10-CM | POA: Diagnosis not present

## 2024-01-11 DIAGNOSIS — R231 Pallor: Secondary | ICD-10-CM | POA: Diagnosis not present

## 2024-01-11 DIAGNOSIS — Z853 Personal history of malignant neoplasm of breast: Secondary | ICD-10-CM | POA: Diagnosis not present

## 2024-01-17 DIAGNOSIS — E860 Dehydration: Secondary | ICD-10-CM | POA: Diagnosis not present

## 2024-01-23 ENCOUNTER — Ambulatory Visit: Payer: BC Managed Care – PPO | Admitting: Family Medicine

## 2024-02-06 ENCOUNTER — Ambulatory Visit: Payer: BC Managed Care – PPO

## 2024-02-06 ENCOUNTER — Encounter: Payer: Self-pay | Admitting: Family Medicine

## 2024-02-06 VITALS — BP 135/84 | HR 80 | Temp 98.2°F | Ht 62.5 in | Wt 189.0 lb

## 2024-02-06 DIAGNOSIS — H9203 Otalgia, bilateral: Secondary | ICD-10-CM | POA: Diagnosis not present

## 2024-02-06 DIAGNOSIS — R55 Syncope and collapse: Secondary | ICD-10-CM | POA: Diagnosis not present

## 2024-02-06 MED ORDER — NEOMYCIN-POLYMYXIN-HC 1 % OT SOLN: 3.0000 [drp] | Freq: Four times a day (QID) | OTIC | 0 refills | Status: DC

## 2024-02-06 NOTE — Progress Notes (Signed)
 Subjective:  Patient ID: Grace Gomez, female    DOB: [birth year], 58 y.o.   MRN: [id number]  Patient Care Team: Junie Spencer, FNP as PCP - General (Nurse Practitioner) Ellouise Newer, PA-C (Inactive) (Oncology)   Chief Complaint:  Follow-up (ED f/u/Weakness, syncopal event/Cardio referral/)  HPI: Grace Gomez is a 58 y.o. female presenting on 02/06/2024 for Follow-up (ED f/u/Weakness, syncopal event/Cardio referral/)  ED Follow UP  Patient presented to ED on 01/11/24 with lightheadedness, weakness, and diaphoresis while at work. She had unremarkable CT and Chest Xray. She received IV fluids and was discharged. Since discharge has not had any issues. Denies any chest pain, shortness of breath, lightheadedness or diaphoresis. She works at [employer] and reports that she has a very physically demanding job where she is constantly walking, lifting, and sweating.   Bilateral Ear pain  In addition, she complains of bilateral ear pain. Reports that it started last Monday with toothache, she was seen Tuesday by Dentist and given a good report. She was then seen by work PA and received ofloxacin ear drops. This has not helped. States that her right ear is very painful and her left ear is somewhat painful. She has to wear ear plugs and muffs and sweating often. Denies cough, rhinorrhea, and congestion. Endorses dry throat.    Relevant past medical, surgical, family, and social history reviewed and updated as indicated.  Allergies and medications reviewed and updated. Data reviewed: Chart in Epic.   Past Medical History:  Diagnosis Date   Breast CA (HCC)    GERD (gastroesophageal reflux disease)    Hyperlipidemia    Hypertension    IBS (irritable bowel syndrome)    diarrhea    Past Surgical History:  Procedure Laterality Date   BREAST BIOPSY Right 2020   invasive ductal ca   BREAST EXCISIONAL BIOPSY Right 2020   invaseive ductal CA   BREAST LUMPECTOMY     HERNIA REPAIR     TUBAL  LIGATION  1990    Social History   Socioeconomic History   Marital status: Single    Spouse name: Not on file   Number of children: 3   Years of education: Not on file   Highest education level: Not on file  Occupational History   Occupation: Single Charity fundraiser  Tobacco Use   Smoking status: Former    Current packs/day: 0.00    Average packs/day: 1 pack/day for 21.1 years (21.1 ttl pk-yrs)    Types: Cigarettes    Start date: 07/06/1980    Quit date: 08/20/2001    Years since quitting: 22.4   Smokeless tobacco: Never  Vaping Use   Vaping status: Never Used  Substance and Sexual Activity   Alcohol use: No   Drug use: No   Sexual activity: Not on file  Other Topics Concern   Not on file  Social History Narrative   Not on file   Social Drivers of Health   Financial Resource Strain: Low Risk  (11/11/2019)   Received [facility], [facility]   Overall Financial Resource Strain (CARDIA)    Difficulty of Paying Living Expenses: Not hard at all  Food Insecurity: No Food Insecurity (05/03/2022)   Received from [facility], [facility]   Hunger Vital Sign    Worried About Running Out of Food in the Last Year: Never true    Ran Out of Food in the Last Year: Never true  Transportation Needs: Unknown (11/11/2019)  Received from [facility], [facility]   [facility] - Transportation    Lack of Transportation (Medical): Patient declined    Lack of Transportation (Non-Medical): Patient declined  Physical Activity: Unknown (11/11/2019)   Received from [facility], [facility]   Exercise Vital Sign    Days of Exercise per Week: Patient declined    Minutes of Exercise per Session: Patient declined  Stress: No Stress Concern Present (11/11/2019)   Received from [facility], [facility] of Occupational Health - Occupational Stress Questionnaire    Feeling of Stress : Only a little  Social Connections: Unknown (04/20/2022)    Received from [facility], [facility]   Social Network    Social Network: Not on file  Intimate Partner Violence: Not At Risk (09/07/2022)   Received from [facility], [facility]   Humiliation, Afraid, Rape, and Kick questionnaire    Fear of Current or Ex-Partner: No    Emotionally Abused: No    Physically Abused: No    Sexually Abused: No    Outpatient Encounter Medications as of 02/06/2024  Medication Sig   dexlansoprazole (DEXILANT) 60 MG capsule Take 1 capsule (60 mg total) by mouth daily.   ofloxacin (FLOXIN) 0.3 % OTIC solution SMARTSIG:In Ear(s)   Vitamin D, Ergocalciferol, (DRISDOL) 1.25 MG (50000 UNIT) CAPS capsule Take 1 capsule (50,000 Units total) by mouth every 7 (seven) days.   [DISCONTINUED] amoxicillin (AMOXIL) 875 MG tablet Take 1 tablet (875 mg total) by mouth 2 (two) times daily.   No facility-administered encounter medications on file as of 02/06/2024.    Allergies  Allergen Reactions   Prednisone Nausea And Vomiting   Rosuvastatin Other (See Comments)   Sulfa Antibiotics Hives    Review of Systems As per HPI  Objective:  BP 135/84   Pulse 80   Temp 98.2 F (36.8 C)   Ht 5' 2.5" (1.588 m)   Wt 189 lb (85.7 kg)   LMP 01/18/2018   SpO2 95%   BMI 34.02 kg/m    Wt Readings from Last 3 Encounters:  02/06/24 189 lb (85.7 kg)  10/02/23 190 lb 12.8 oz (86.5 kg)  07/26/23 191 lb 9.6 oz (86.9 kg)    Physical Exam Constitutional:      General: She is awake. She is not in acute distress.    Appearance: Normal appearance. She is well-developed and well-groomed. She is not ill-appearing, toxic-appearing or diaphoretic.  HENT:     Right Ear: A middle ear effusion is present.     Left Ear: A middle ear effusion is present.     Ears:     Comments: Yellow, thick drainage in right ear canal.  Dry, yellow/Keisler drainage in left ear canal.     Nose: No congestion or rhinorrhea.     Right Sinus: No maxillary sinus tenderness or frontal sinus  tenderness.     Left Sinus: No maxillary sinus tenderness or frontal sinus tenderness.  Cardiovascular:     Rate and Rhythm: Normal rate and regular rhythm.     Pulses: Normal pulses.          Radial pulses are 2+ on the right side and 2+ on the left side.       Posterior tibial pulses are 2+ on the right side and 2+ on the left side.     Heart sounds: Normal heart sounds. No murmur heard.    No gallop.  Pulmonary:  Effort: Pulmonary effort is normal. No respiratory distress.     Breath sounds: Normal breath sounds. No stridor. No wheezing, rhonchi or rales.  Musculoskeletal:     Cervical back: Full passive range of motion without pain and neck supple.     Right lower leg: No edema.     Left lower leg: No edema.  Skin:    General: Skin is warm.     Capillary Refill: Capillary refill takes less than 2 seconds.  Neurological:     General: No focal deficit present.     Mental Status: She is alert, oriented to person, place, and time and easily aroused. Mental status is at baseline.     GCS: GCS eye subscore is 4. GCS verbal subscore is 5. GCS motor subscore is 6.     Motor: No weakness.  Psychiatric:        Attention and Perception: Attention and perception normal.        Mood and Affect: Mood and affect normal.        Speech: Speech normal.        Behavior: Behavior normal. Behavior is cooperative.        Thought Content: Thought content normal. Thought content does not include homicidal or suicidal ideation. Thought content does not include homicidal or suicidal plan.        Cognition and Memory: Cognition and memory normal.        Judgment: Judgment normal.     Results for orders placed or performed in visit on 08/08/23  Lab report - scanned   Collection Time: 07/05/23 12:36 PM  Result Value Ref Range   EGFR (Non-African Amer.) 101    A1c 5.9        02/06/2024    8:48 AM 10/02/2023   10:22 AM 07/19/2022    2:25 PM 07/19/2022    2:24 PM 02/24/2021    9:41 AM  Depression  screen PHQ 2/9  Decreased Interest 0 0  0 0  Down, Depressed, Hopeless 0 0  0 0  PHQ - 2 Score 0 0  0 0  Altered sleeping 0 0 0    Tired, decreased energy 0 0 0    Change in appetite 0 0 0    Feeling bad or failure about yourself  0 0 0    Trouble concentrating 0 0 0    Moving slowly or fidgety/restless 0 0 0    Suicidal thoughts 0 0 0    PHQ-9 Score 0 0     Difficult doing work/chores Not difficult at all Not difficult at all          02/06/2024    8:49 AM 10/02/2023   10:23 AM 07/19/2022    2:24 PM  GAD 7 : Generalized Anxiety Score  Nervous, Anxious, on Edge 0 0 0  Control/stop worrying 0 0 0  Worry too much - different things 0 0 0  Trouble relaxing 0 0 0  Restless 0 0 0  Easily annoyed or irritable 0 0 0  Afraid - awful might happen 0 0 0  Total GAD 7 Score 0 0 0  Anxiety Difficulty Not difficult at all Not difficult at all Not difficult at all      Pertinent labs & imaging results that were available during my care of the patient were reviewed by me and considered in my medical decision making.  Assessment & Plan:  Analyce was seen today for follow-up.  Diagnoses and all orders  for this visit:  Near syncope Reviewed notes from ED 01/11/24, [place], PA. Reviewed labs and imaging from event as well. Labs as below. Will communicate results to patient once available. Will await results to determine next steps.  -     CMP14+EGFR -     CBC with Differential/Platelet  Ear pain, bilateral Will start medication as below. Discussed with patient cleaning of ear plugs and muffs at work.  -     NEOMYCIN-POLYMYXIN-HYDROCORTISONE (CORTISPORIN) 1 % SOLN OTIC solution; Place 3 drops into both ears 4 (four) times daily. For 7-10 days  Continue all other maintenance medications.  Follow up plan: Return if symptoms worsen or fail to improve.   Continue healthy lifestyle choices, including diet (rich in fruits, vegetables, and lean proteins, and low in salt and simple carbohydrates)  and exercise (at least 30 minutes of moderate physical activity daily).  Written and verbal instructions provided   The above assessment and management plan was discussed with the patient. The patient verbalized understanding of and has agreed to the management plan. Patient is aware to call the clinic if they develop any new symptoms or if symptoms persist or worsen. Patient is aware when to return to the clinic for a follow-up visit. Patient educated on when it is appropriate to go to the emergency department.   Neale Burly, DNP-FNP [facility] [address] [place], [place] [address] [phone]

## 2024-02-07 ENCOUNTER — Encounter: Payer: Self-pay | Admitting: Family Medicine

## 2024-02-07 LAB — CMP14+EGFR
ALT: 15 IU/L (ref 0–32)
AST: 19 IU/L (ref 0–40)
Albumin: 4.3 g/dL (ref 3.8–4.9)
Alkaline Phosphatase: 121 IU/L (ref 44–121)
BUN/Creatinine Ratio: 20 (ref 9–23)
BUN: 18 mg/dL (ref 6–24)
Bilirubin Total: 0.5 mg/dL (ref 0.0–1.2)
CO2: 21 mmol/L (ref 20–29)
Calcium: 9.9 mg/dL (ref 8.7–10.2)
Chloride: 106 mmol/L (ref 96–106)
Creatinine, Ser: 0.9 mg/dL (ref 0.57–1.00)
Globulin, Total: 3.1 g/dL (ref 1.5–4.5)
Glucose: 99 mg/dL (ref 70–99)
Potassium: 5.2 mmol/L (ref 3.5–5.2)
Sodium: 143 mmol/L (ref 134–144)
Total Protein: 7.4 g/dL (ref 6.0–8.5)
eGFR: 74 mL/min/{1.73_m2} (ref >59)

## 2024-02-07 LAB — CBC WITH DIFFERENTIAL/PLATELET
Basophils Absolute: 0.1 10*3/uL (ref 0.0–0.2)
Basos: 1 %
EOS (ABSOLUTE): 0.3 10*3/uL (ref 0.0–0.4)
Eos: 3 %
Hematocrit: 43 % (ref 34.0–46.6)
Hemoglobin: 13.6 g/dL (ref 11.1–15.9)
Immature Grans (Abs): 0 10*3/uL (ref 0.0–0.1)
Immature Granulocytes: 0 %
Lymphocytes Absolute: 1.8 10*3/uL (ref 0.7–3.1)
Lymphs: 17 %
MCH: 28.9 pg (ref 26.6–33.0)
MCHC: 31.6 g/dL (ref 31.5–35.7)
MCV: 91 fL (ref 79–97)
Monocytes Absolute: 0.6 10*3/uL (ref 0.1–0.9)
Monocytes: 6 %
Neutrophils Absolute: 8.1 10*3/uL — ABNORMAL HIGH (ref 1.4–7.0)
Neutrophils: 73 %
Platelets: 229 10*3/uL (ref 150–450)
RBC: 4.71 x10E6/uL (ref 3.77–5.28)
RDW: 12.6 % (ref 11.7–15.4)
WBC: 10.9 10*3/uL — ABNORMAL HIGH (ref 3.4–10.8)

## 2024-02-07 NOTE — Progress Notes (Signed)
 Labs with slight variation that are not concerning at this time, please follow up with PCP for further management.

## 2024-02-08 DIAGNOSIS — H6991 Unspecified Eustachian tube disorder, right ear: Secondary | ICD-10-CM | POA: Diagnosis not present

## 2024-02-08 DIAGNOSIS — J069 Acute upper respiratory infection, unspecified: Secondary | ICD-10-CM | POA: Diagnosis not present

## 2024-02-11 ENCOUNTER — Telehealth: Payer: Self-pay | Admitting: Family

## 2024-02-11 NOTE — Telephone Encounter (Signed)
 Ok to switch

## 2024-02-11 NOTE — Telephone Encounter (Signed)
 If ok to transfer, will call back to notify patient.  Copied from CRM [id number]. Topic: Appointments - Transfer of Care >> Feb 11, 2024 11:25 AM Whitney O wrote: Pt is requesting to transfer FROM: Grace Gomez  Pt is requesting to transfer TO: Grace Gomez Reason for requested transfer: no particular reason just wanting to transfer since it was brought to my attention It is the responsibility of the team the patient would like to transfer to (Dr. Gilford Silvius) to reach out to the patient if for any reason this transfer is not acceptable.

## 2024-02-12 NOTE — Telephone Encounter (Signed)
Appt scheduled for 02/20/24

## 2024-02-15 ENCOUNTER — Ambulatory Visit: Admitting: Family Medicine

## 2024-02-20 ENCOUNTER — Encounter: Payer: Self-pay | Admitting: Family Medicine

## 2024-02-20 ENCOUNTER — Ambulatory Visit: Admitting: Family Medicine

## 2024-02-20 VITALS — BP 142/80 | HR 68 | Temp 96.2°F | Ht 62.5 in | Wt 188.6 lb

## 2024-02-20 DIAGNOSIS — N3001 Acute cystitis with hematuria: Secondary | ICD-10-CM

## 2024-02-20 DIAGNOSIS — Z87891 Personal history of nicotine dependence: Secondary | ICD-10-CM

## 2024-02-20 DIAGNOSIS — E782 Mixed hyperlipidemia: Secondary | ICD-10-CM | POA: Diagnosis not present

## 2024-02-20 DIAGNOSIS — I10 Essential (primary) hypertension: Secondary | ICD-10-CM

## 2024-02-20 DIAGNOSIS — Z853 Personal history of malignant neoplasm of breast: Secondary | ICD-10-CM

## 2024-02-20 DIAGNOSIS — I700 Atherosclerosis of aorta: Secondary | ICD-10-CM | POA: Diagnosis not present

## 2024-02-20 DIAGNOSIS — K219 Gastro-esophageal reflux disease without esophagitis: Secondary | ICD-10-CM

## 2024-02-20 DIAGNOSIS — R6889 Other general symptoms and signs: Secondary | ICD-10-CM | POA: Diagnosis not present

## 2024-02-20 DIAGNOSIS — R7303 Prediabetes: Secondary | ICD-10-CM | POA: Diagnosis not present

## 2024-02-20 DIAGNOSIS — L309 Dermatitis, unspecified: Secondary | ICD-10-CM

## 2024-02-20 DIAGNOSIS — R9389 Abnormal findings on diagnostic imaging of other specified body structures: Secondary | ICD-10-CM

## 2024-02-20 DIAGNOSIS — R21 Rash and other nonspecific skin eruption: Secondary | ICD-10-CM

## 2024-02-20 DIAGNOSIS — E559 Vitamin D deficiency, unspecified: Secondary | ICD-10-CM | POA: Diagnosis not present

## 2024-02-20 LAB — BAYER DCA HB A1C WAIVED: HB A1C (BAYER DCA - WAIVED): 5.9 % — ABNORMAL HIGH (ref 4.8–5.6)

## 2024-02-20 MED ORDER — TRIAMCINOLONE ACETONIDE 0.1 % EX CREA: 1.0000 | TOPICAL_CREAM | Freq: Two times a day (BID) | CUTANEOUS | 0 refills | Status: DC

## 2024-02-20 NOTE — Progress Notes (Signed)
 Subjective:  Patient ID: Grace Gomez, female    DOB: 11/26/1966, 58 y.o.   MRN: [id number]  Patient Care Team: Sonny Masters, FNP as PCP - General (Family Medicine) Jacalyn Lefevre, Maurine Minister, PA-C (Inactive) (Oncology)   Chief Complaint:  Establish Care (Previous christy patient )   HPI: Grace Gomez is a 58 y.o. female presenting on 02/20/2024 for Establish Care (Previous christy patient )   Discussed the use of AI scribe software for clinical note transcription with the patient, who gave verbal consent to proceed.  History of Present Illness   The patient, with a history of breast cancer, presents for a follow-up regarding her recent health concerns.  She has a history of right-sided breast cancer treated with lumpectomy and 20 sessions of radiation. She has not seen her oncologist in about two years, although she used to have appointments every four months. She had a mammogram in December as part of her routine annual screening.  She has been experiencing intermittent dry skin on her right nipple since her breast cancer treatment. She recalls being given an ointment by her oncologist but cannot remember the name. She has not seen her cancer doctor in nearly two years.  She recently experienced an episode at work where she felt unwell due to the heat and was taken to the emergency room. A chest x-ray revealed a 'little bit of plaque' in her main artery and mentioned emphysema, although she did not see this in the report. She has a history of smoking from 2003 to 2021.  She recently felt symptoms of a urinary tract infection and was seen by her PA at work, who prescribed three days of Cipro, which has improved her symptoms.  She has a history of elevated cholesterol and prediabetes. Her cholesterol was slightly elevated in 2022, and her A1c was in the prediabetic range. She is not currently on any cholesterol medication, although she has tried rosuvastatin in the past. She is not taking any  medication for blood pressure, which has been elevated recently.  She has a history of low normal vitamin D levels, last checked in 2022. She wants to have her kidney function checked as part of her lab work.  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 No increased hunger, thirst, or urination, shortness of breath, cough, chest pain, leg swelling, headaches, dizziness, or changes in vision. She reports a recent upper respiratory infection diagnosed as viral, which has since resolved.          Relevant past medical, surgical, family, and social history reviewed and updated as indicated.  Allergies and medications reviewed and updated. Data reviewed: Chart in Epic.   Past Medical History:  Diagnosis Date   Breast CA (HCC)    GERD (gastroesophageal reflux disease)    Hyperlipidemia    Hypertension    IBS (irritable bowel syndrome)    diarrhea    Past Surgical History:  Procedure Laterality Date   BREAST BIOPSY Right 2020   invasive ductal ca   BREAST EXCISIONAL BIOPSY Right 2020   invaseive ductal CA   BREAST LUMPECTOMY     HERNIA REPAIR     TUBAL LIGATION  1990    Social History   Socioeconomic History   Marital status: Single    Spouse name: Not on file   Number of children: 3   Years of education: Not on file   Highest education level: Not on file  Occupational History   Occupation: Single Charity fundraiser  Tobacco Use   Smoking status: Former    Current packs/day: 0.00    Average packs/day: 1 pack/day for 21.1 years (21.1 ttl pk-yrs)    Types: Cigarettes    Start date: 07/06/1980    Quit date: 08/20/2001    Years since quitting: 22.5   Smokeless tobacco: Never  Vaping Use   Vaping status: Never Used  Substance and Sexual Activity   Alcohol use: No   Drug use: No   Sexual activity: Not on file  Other Topics Concern   Not on file  Social History Narrative   Not on file   Social Drivers of Health    Financial Resource Strain: Low Risk  (11/11/2019)   Received from [facility]   Overall Financial Resource Strain (CARDIA)    Difficulty of Paying Living Expenses: Not hard at all  Food Insecurity: No Food Insecurity (05/03/2022)   Received from [facility], [facility]   Hunger Vital Sign    Worried About Running Out of Food in the Last Year: Never true    Ran Out of Food in the Last Year: Never true  Transportation Needs: Unknown (11/11/2019)   Received from [facility], [facility]   [facility] - Transportation    Lack of Transportation (Medical): Patient declined    Lack of Transportation (Non-Medical): Patient declined  Physical Activity: Unknown (11/11/2019)   Received from [facility], [facility]   Exercise Vital Sign    Days of Exercise per Week: Patient declined    Minutes of Exercise per Session: Patient declined  Stress: No Stress Concern Present (11/11/2019)   Received from [facility], [facility] - Occupational Stress Questionnaire    Feeling of Stress : Only a little  Social Connections: Unknown (04/20/2022)   Received from [facility], [facility]   Social Network    Social Network: Not on file  Intimate Partner Violence: Not At Risk (09/07/2022)   Received from [facility], [facility]   Humiliation, Afraid, Rape, and Kick questionnaire    Fear of Current or Ex-Partner: No    Emotionally Abused: No    Physically Abused: No    Sexually Abused: No    Outpatient Encounter Medications as of 02/20/2024  Medication Sig   ciprofloxacin (CIPRO) 250 MG tablet SMARTSIG:1 Tablet(s) By Mouth Every 12 Hours   dexlansoprazole (DEXILANT) 60 MG capsule Take 1 capsule (60 mg total) by mouth daily.   triamcinolone cream (KENALOG) 0.1 % Apply 1 Application topically 2 (two) times daily.   Vitamin D, Ergocalciferol, (DRISDOL) 1.25 MG (50000 UNIT) CAPS capsule Take 1 capsule (50,000  Units total) by mouth every 7 (seven) days.   [DISCONTINUED] NEOMYCIN-POLYMYXIN-HYDROCORTISONE (CORTISPORIN) 1 % SOLN OTIC solution Place 3 drops into both ears 4 (four) times daily. For 7-10 days   No facility-administered encounter medications on file as of 02/20/2024.    Allergies  Allergen Reactions   Prednisone Nausea And Vomiting   Rosuvastatin Other (See Comments)   Sulfa Antibiotics Hives    Pertinent ROS per HPI, otherwise unremarkable      Objective:  BP (!) 142/80   Pulse 68   Temp (!) 96.2 F (35.7 C)   Ht 5' 2.5" (1.588 m)   Wt 188 lb 9.6 oz (85.5 kg)   LMP 01/18/2018   SpO2 93%   BMI 33.95 kg/m  Wt Readings from Last 3 Encounters:  02/20/24 188 lb 9.6 oz (85.5 kg)  02/06/24 189 lb (85.7 kg)  10/02/23 190 lb 12.8 oz (86.5 kg)    Physical Exam Vitals and nursing note reviewed.  Constitutional:      General: She is not in acute distress.    Appearance: Normal appearance. She is obese. She is not ill-appearing, toxic-appearing or diaphoretic.  HENT:     Head: Normocephalic and atraumatic.     Right Ear: Tympanic membrane, ear canal and external ear normal.     Left Ear: Tympanic membrane, ear canal and external ear normal.     Nose: Nose normal.     Mouth/Throat:     Mouth: Mucous membranes are moist.     Pharynx: Oropharynx is clear.  Eyes:     Conjunctiva/sclera: Conjunctivae normal.     Pupils: Pupils are equal, round, and reactive to light.  Cardiovascular:     Rate and Rhythm: Normal rate and regular rhythm.     Heart sounds: Normal heart sounds.  Pulmonary:     Effort: Pulmonary effort is normal.     Breath sounds: Normal breath sounds.  Chest:  Breasts:    Right: Skin change present.    Musculoskeletal:     Cervical back: Normal range of motion and neck supple.     Right lower leg: No edema.     Left lower leg: No edema.  Skin:    General: Skin is warm and dry.     Capillary Refill: Capillary refill takes less than 2 seconds.   Neurological:     General: No focal deficit present.     Mental Status: She is alert and oriented to person, place, and time.  Psychiatric:        Mood and Affect: Mood normal.        Behavior: Behavior normal.        Thought Content: Thought content normal.        Judgment: Judgment normal.     Results for orders placed or performed in visit on 02/06/24  CMP14+EGFR   Collection Time: 02/06/24  9:33 AM  Result Value Ref Range   Glucose 99 70 - 99 mg/dL   BUN 18 6 - 24 mg/dL   Creatinine, Ser 1.61 0.57 - 1.00 mg/dL   eGFR 74 >09 UE/AVW/0.98   BUN/Creatinine Ratio 20 9 - 23   Sodium 143 134 - 144 mmol/L   Potassium 5.2 3.5 - 5.2 mmol/L   Chloride 106 96 - 106 mmol/L   CO2 21 20 - 29 mmol/L   Calcium 9.9 8.7 - 10.2 mg/dL   Total Protein 7.4 6.0 - 8.5 g/dL   Albumin 4.3 3.8 - 4.9 g/dL   Globulin, Total 3.1 1.5 - 4.5 g/dL   Bilirubin Total 0.5 0.0 - 1.2 mg/dL   Alkaline Phosphatase 121 44 - 121 IU/L   AST 19 0 - 40 IU/L   ALT 15 0 - 32 IU/L  CBC with Differential/Platelet   Collection Time: 02/06/24  9:33 AM  Result Value Ref Range   WBC 10.9 (H) 3.4 - 10.8 x10E3/uL   RBC 4.71 3.77 - 5.28 x10E6/uL   Hemoglobin 13.6 11.1 - 15.9 g/dL   Hematocrit 11.9 14.7 - 46.6 %   MCV 91 79 - 97 fL   MCH 28.9 26.6 - 33.0 pg   MCHC 31.6 31.5 - 35.7 g/dL   RDW 82.9 56.2 - 13.0 %   Platelets 229 150 -  450 x10E3/uL   Neutrophils 73 Not Estab. %   Lymphs 17 Not Estab. %   Monocytes 6 Not Estab. %   Eos 3 Not Estab. %   Basos 1 Not Estab. %   Neutrophils Absolute 8.1 (H) 1.4 - 7.0 x10E3/uL   Lymphocytes Absolute 1.8 0.7 - 3.1 x10E3/uL   Monocytes Absolute 0.6 0.1 - 0.9 x10E3/uL   EOS (ABSOLUTE) 0.3 0.0 - 0.4 x10E3/uL   Basophils Absolute 0.1 0.0 - 0.2 x10E3/uL   Immature Granulocytes 0 Not Estab. %   Immature Grans (Abs) 0.0 0.0 - 0.1 x10E3/uL       Pertinent labs & imaging results that were available during my care of the patient were reviewed by me and considered in my medical  decision making.  Assessment & Plan:  Diantha was seen today for establish care.  Diagnoses and all orders for this visit:  Essential hypertension -     Anemia Profile B -     CMP14+EGFR -     Lipid panel -     Thyroid Panel With TSH  Vitamin D deficiency -     CMP14+EGFR -     VITAMIN D 25 Hydroxy (Vit-D Deficiency, Fractures)  Mixed hyperlipidemia -     CMP14+EGFR -     Lipid panel  History of right breast cancer -     Anemia Profile B -     US BREAST COMPLETE UNI RIGHT INC AXILLA -     MM 3D DIAGNOSTIC MAMMOGRAM UNILATERAL RIGHT BREAST; Future  Gastroesophageal reflux disease without esophagitis -     Anemia Profile B  Aortic atherosclerosis (HCC) -     CMP14+EGFR -     Lipid panel  Abnormal CT of the chest -     Ambulatory referral to Pulmonology  Former smoker -     Ambulatory referral to Pulmonology  Acute cystitis with hematuria -     Urinalysis, Complete; Future -     Urine Culture; Future  Prediabetes -     CMP14+EGFR -     Bayer DCA Hb A1c Waived  Dermatitis -     triamcinolone cream (KENALOG) 0.1 %; Apply 1 Application topically 2 (two) times daily. -     US BREAST COMPLETE UNI RIGHT INC AXILLA -     MM 3D DIAGNOSTIC MAMMOGRAM UNILATERAL RIGHT BREAST; Future  Rash of body -     US BREAST COMPLETE UNI RIGHT INC AXILLA -     MM 3D DIAGNOSTIC MAMMOGRAM UNILATERAL RIGHT BREAST; Future     Assessment and Plan    Urinary Tract Infection (UTI) Recent onset of UTI symptoms treated with a 3-day course of ciprofloxacin, resulting in symptom improvement. Ciprofloxacin is appropriate and effective for this condition. - Order urine test in 1-2 weeks to confirm resolution of UTI.  Breast Cancer Follow-up Right-sided breast cancer treated with lumpectomy and radiation. Dry skin on right nipple suggests radiation dermatitis, but differential includes Paget's disease. Diagnostic imaging is necessary to rule out Paget's disease. - Order diagnostic ultrasound  and mammogram of the right breast. - Prescribe triamcinolone cream for suspected radiation dermatitis. - Refer to oncologist for follow-up.  Aortic Atherosclerosis Coronary calcifications on recent imaging indicate aortic atherosclerosis. No current cholesterol medication despite elevated cholesterol. Discussed pravastatin due to its lower risk of muscle aches compared to other statins. - Check cholesterol levels. - Discuss potential cholesterol medications, including pravastatin.  Hypertension Elevated blood pressure readings with no current antihypertensive treatment. Advised regular monitoring  and reporting if consistently above 130/80 mmHg. Untreated hypertension can lead to renal dysfunction. - Provide blood pressure log for home monitoring. - Advise to report if blood pressure consistently exceeds 130/80 mmHg.  Possible Emphysema Long-term smoking history with imaging suggesting emphysema or lung scarring. Requires further evaluation to confirm diagnosis. - Refer to pulmonologist for pulmonary function testing.  Prediabetes Previous A1c in prediabetic range. No current symptoms of diabetes. Monitoring A1c levels is important to prevent progression to diabetes. - Check A1c levels.  General Health Maintenance Up to date with mammogram, colonoscopy, and Pap smear. Vitamin D was low normal in 2022. Comprehensive blood work is necessary to monitor overall health, including kidney function and triglycerides. - Check vitamin D levels. - Perform comprehensive blood work including kidney function and triglycerides.  Follow-up Requires follow-up for multiple health concerns and ongoing monitoring. Coordination of care is essential given potential loss of insurance coverage due to job loss. - Schedule follow-up appointment in 6-8 weeks. - Ensure she receives call for mammogram and pulmonology appointments.        Total time spent with patient today was 42 minutes.   Continue all other  maintenance medications.  Follow up plan: Return in about 6 weeks (around 04/02/2024) for HTN.   Continue healthy lifestyle choices, including diet (rich in fruits, vegetables, and lean proteins, and low in salt and simple carbohydrates) and exercise (at least 30 minutes of moderate physical activity daily).  Educational handout given for DASH diet, HTN  The above assessment and management plan was discussed with the patient. The patient verbalized understanding of and has agreed to the management plan. Patient is aware to call the clinic if they develop any new symptoms or if symptoms persist or worsen. Patient is aware when to return to the clinic for a follow-up visit. Patient educated on when it is appropriate to go to the emergency department.   Kari Baars, FNP-C [facility] [phone]

## 2024-02-20 NOTE — Patient Instructions (Signed)
 Goal BP:  For patients younger than 60: Goal BP < 140/90. For patients 60 and older: Goal BP < 150/90. For patients with diabetes: Goal BP < 140/90.  Take your medications faithfully as prescribed. Maintain a healthy weight. Get at least 150 minutes of aerobic exercise per week. Minimize salt intake, less than 2000 mg per day. Minimize alcohol intake.  DASH Eating Plan DASH stands for "Dietary Approaches to Stop Hypertension." The DASH eating plan is a healthy eating plan that has been shown to reduce high blood pressure (hypertension). Additional health benefits may include reducing the risk of type 2 diabetes mellitus, heart disease, and stroke. The DASH eating plan may also help with weight loss.  WHAT DO I NEED TO KNOW ABOUT THE DASH EATING PLAN? For the DASH eating plan, you will follow these general guidelines: Choose foods with a percent daily value for sodium of less than 5% (as listed on the food label). Use salt-free seasonings or herbs instead of table salt or sea salt. Check with your health care provider or pharmacist before using salt substitutes. Eat lower-sodium products, often labeled as "lower sodium" or "no salt added." Eat fresh foods. Eat more vegetables, fruits, and low-fat dairy products. Choose whole grains. Look for the word "whole" as the first word in the ingredient list. Choose fish and skinless chicken or [place] more often than red meat. Limit fish, poultry, and meat to 6 oz (170 g) each day. Limit sweets, desserts, sugars, and sugary drinks. Choose heart-healthy fats. Limit cheese to 1 oz (28 g) per day. Eat more home-cooked food and less restaurant, buffet, and fast food. Limit fried foods. Cook foods using methods other than frying. Limit canned vegetables. If you do use them, rinse them well to decrease the sodium. When eating at a restaurant, ask that your food be prepared with less salt, or no salt if possible.  WHAT FOODS CAN I EAT? Seek help from  a dietitian for individual calorie needs.  Grains Whole grain or whole wheat bread. Brown rice. Whole grain or whole wheat pasta. Quinoa, bulgur, and whole grain cereals. Low-sodium cereals. Corn or whole wheat flour tortillas. Whole grain cornbread. Whole grain crackers. Low-sodium crackers.  Vegetables Fresh or frozen vegetables (raw, steamed, roasted, or grilled). Low-sodium or reduced-sodium tomato and vegetable juices. Low-sodium or reduced-sodium tomato sauce and paste. Low-sodium or reduced-sodium canned vegetables.   Fruits All fresh, canned (in natural juice), or frozen fruits.  Meat and Other Protein Products Ground beef (85% or leaner), grass-fed beef, or beef trimmed of fat. Skinless chicken or [place]. Ground chicken or [place]. Pork trimmed of fat. All fish and seafood. Eggs. Dried beans, peas, or lentils. Unsalted nuts and seeds. Unsalted canned beans.  Dairy Low-fat dairy products, such as skim or 1% milk, 2% or reduced-fat cheeses, low-fat ricotta or cottage cheese, or plain low-fat yogurt. Low-sodium or reduced-sodium cheeses.  Fats and Oils Tub margarines without trans fats. Light or reduced-fat mayonnaise and salad dressings (reduced sodium). Avocado. Safflower, olive, or canola oils. Natural peanut or almond butter.  Other Unsalted popcorn and pretzels. The items listed above may not be a complete list of recommended foods or beverages. Contact your dietitian for more options.  WHAT FOODS ARE NOT RECOMMENDED?  Grains White bread. White pasta. White rice. Refined cornbread. Bagels and croissants. Crackers that contain trans fat.  Vegetables Creamed or fried vegetables. Vegetables in a cheese sauce. Regular canned vegetables. Regular canned tomato sauce and paste. Regular tomato and vegetable juices.  Fruits Dried fruits. Canned fruit in light or heavy syrup. Fruit juice.  Meat and Other Protein Products Fatty cuts of meat. Ribs, chicken wings, bacon, sausage,  bologna, salami, chitterlings, fatback, hot dogs, bratwurst, and packaged luncheon meats. Salted nuts and seeds. Canned beans with salt.  Dairy Whole or 2% milk, cream, half-and-half, and cream cheese. Whole-fat or sweetened yogurt. Full-fat cheeses or blue cheese. Nondairy creamers and whipped toppings. Processed cheese, cheese spreads, or cheese curds.  Condiments Onion and garlic salt, seasoned salt, table salt, and sea salt. Canned and packaged gravies. Worcestershire sauce. Tartar sauce. Barbecue sauce. Teriyaki sauce. Soy sauce, including reduced sodium. Steak sauce. Fish sauce. Oyster sauce. Cocktail sauce. Horseradish. Ketchup and mustard. Meat flavorings and tenderizers. Bouillon cubes. Hot sauce. Tabasco sauce. Marinades. Taco seasonings. Relishes.  Fats and Oils Butter, stick margarine, lard, shortening, ghee, and bacon fat. Coconut, palm kernel, or palm oils. Regular salad dressings.  Other Pickles and olives. Salted popcorn and pretzels.  The items listed above may not be a complete list of foods and beverages to avoid. Contact your dietitian for more information.  WHERE CAN I FIND MORE INFORMATION? National Heart, Lung, and Blood Institute: [employer] Document Released: 11/16/2011 Document Revised: 04/13/2014 Document Reviewed: 10/01/2013 [facility] Patient Information 2015 [place], [place]. This information is not intended to replace advice given to you by your health care provider. Make sure you discuss any questions you have with your health care provider.   I think that you would greatly benefit from seeing a nutritionist.  If you are interested, please call Dr. Gerilyn Pilgrim at [phone] to schedule an appointment.

## 2024-02-21 LAB — CMP14+EGFR
ALT: 18 IU/L (ref 0–32)
AST: 21 IU/L (ref 0–40)
Albumin: 4.1 g/dL (ref 3.8–4.9)
Alkaline Phosphatase: 116 IU/L (ref 44–121)
BUN/Creatinine Ratio: 26 — ABNORMAL HIGH (ref 9–23)
BUN: 18 mg/dL (ref 6–24)
Bilirubin Total: 0.4 mg/dL (ref 0.0–1.2)
CO2: 24 mmol/L (ref 20–29)
Calcium: 9.8 mg/dL (ref 8.7–10.2)
Chloride: 105 mmol/L (ref 96–106)
Creatinine, Ser: 0.69 mg/dL (ref 0.57–1.00)
Globulin, Total: 2.6 g/dL (ref 1.5–4.5)
Glucose: 87 mg/dL (ref 70–99)
Potassium: 4.8 mmol/L (ref 3.5–5.2)
Sodium: 141 mmol/L (ref 134–144)
Total Protein: 6.7 g/dL (ref 6.0–8.5)
eGFR: 100 mL/min/{1.73_m2} (ref >59)

## 2024-02-21 LAB — ANEMIA PROFILE B
Basophils Absolute: 0.1 10*3/uL (ref 0.0–0.2)
Basos: 1 %
EOS (ABSOLUTE): 0.2 10*3/uL (ref 0.0–0.4)
Eos: 2 %
Ferritin: 180 ng/mL — ABNORMAL HIGH (ref 15–150)
Folate: 7.4 ng/mL (ref >3.0)
Hematocrit: 42.6 % (ref 34.0–46.6)
Hemoglobin: 13.9 g/dL (ref 11.1–15.9)
Immature Grans (Abs): 0 10*3/uL (ref 0.0–0.1)
Immature Granulocytes: 0 %
Iron Saturation: 31 % (ref 15–55)
Iron: 82 ug/dL (ref 27–159)
Lymphocytes Absolute: 1.8 10*3/uL (ref 0.7–3.1)
Lymphs: 26 %
MCH: 30.1 pg (ref 26.6–33.0)
MCHC: 32.6 g/dL (ref 31.5–35.7)
MCV: 92 fL (ref 79–97)
Monocytes Absolute: 0.4 10*3/uL (ref 0.1–0.9)
Monocytes: 5 %
Neutrophils Absolute: 4.6 10*3/uL (ref 1.4–7.0)
Neutrophils: 66 %
Platelets: 294 10*3/uL (ref 150–450)
RBC: 4.62 x10E6/uL (ref 3.77–5.28)
RDW: 12.9 % (ref 11.7–15.4)
Retic Ct Pct: 1.1 % (ref 0.6–2.6)
Total Iron Binding Capacity: 262 ug/dL (ref 250–450)
UIBC: 180 ug/dL (ref 131–425)
Vitamin B-12: 591 pg/mL (ref 232–1245)
WBC: 7 10*3/uL (ref 3.4–10.8)

## 2024-02-21 LAB — LIPID PANEL
Chol/HDL Ratio: 4.4 ratio (ref 0.0–4.4)
Cholesterol, Total: 203 mg/dL — ABNORMAL HIGH (ref 100–199)
HDL: 46 mg/dL (ref >39)
LDL Chol Calc (NIH): 129 mg/dL — ABNORMAL HIGH (ref 0–99)
Triglycerides: 155 mg/dL — ABNORMAL HIGH (ref 0–149)
VLDL Cholesterol Cal: 28 mg/dL (ref 5–40)

## 2024-02-21 LAB — THYROID PANEL WITH TSH
Free Thyroxine Index: 2.2 (ref 1.2–4.9)
T3 Uptake Ratio: 25 % (ref 24–39)
T4, Total: 8.8 ug/dL (ref 4.5–12.0)
TSH: 0.983 u[IU]/mL (ref 0.450–4.500)

## 2024-02-21 LAB — VITAMIN D 25 HYDROXY (VIT D DEFICIENCY, FRACTURES): Vit D, 25-Hydroxy: 39.3 ng/mL (ref 30.0–100.0)

## 2024-03-03 DIAGNOSIS — L309 Dermatitis, unspecified: Secondary | ICD-10-CM | POA: Diagnosis not present

## 2024-03-03 DIAGNOSIS — Z853 Personal history of malignant neoplasm of breast: Secondary | ICD-10-CM | POA: Diagnosis not present

## 2024-03-03 DIAGNOSIS — R21 Rash and other nonspecific skin eruption: Secondary | ICD-10-CM | POA: Diagnosis not present

## 2024-03-03 DIAGNOSIS — Z08 Encounter for follow-up examination after completed treatment for malignant neoplasm: Secondary | ICD-10-CM | POA: Diagnosis not present

## 2024-03-16 NOTE — Progress Notes (Deleted)
   Grace Gomez, female    DOB: Jul 11, 1966    MRN: [id number]   Brief patient profile:  ***  yo*** *** referred to pulmonary clinic in [place]  03/19/2024 by *** for ***   Pt not prev seen by PCCM   History of Present Illness  03/19/2024  Pulmonary/ 1st office eval/ Sherene Sires / [place] [facility]  No chief complaint on file.    Dyspnea:  *** Cough: *** Sleep: *** SABA use: *** 02: *** LDSCT:***  No obvious day to day or daytime pattern/variability or assoc excess/ purulent sputum or mucus plugs or hemoptysis or cp or chest tightness, subjective wheeze or overt sinus or hb symptoms.    Also denies any obvious fluctuation of symptoms with weather or environmental changes or other aggravating or alleviating factors except as outlined above   No unusual exposure hx or h/o childhood pna/ asthma or knowledge of premature birth.  Current Allergies, Complete Past Medical History, Past Surgical History, Family History, and Social History were reviewed in [employer] record.  ROS  The following are not active complaints unless bolded Hoarseness, sore throat, dysphagia, dental problems, itching, sneezing,  nasal congestion or discharge of excess mucus or purulent secretions, ear ache,   fever, chills, sweats, unintended wt loss or wt gain, classically pleuritic or exertional cp,  orthopnea pnd or arm/hand swelling  or leg swelling, presyncope, palpitations, abdominal pain, anorexia, nausea, vomiting, diarrhea  or change in bowel habits or change in bladder habits, change in stools or change in urine, dysuria, hematuria,  rash, arthralgias, visual complaints, headache, numbness, weakness or ataxia or problems with walking or coordination,  change in mood or  memory.            Outpatient Medications Prior to Visit  Medication Sig Dispense Refill   ciprofloxacin (CIPRO) 250 MG tablet SMARTSIG:1 Tablet(s) By Mouth Every 12 Hours     dexlansoprazole (DEXILANT) 60 MG capsule Take  1 capsule (60 mg total) by mouth daily. 90 capsule 2   triamcinolone cream (KENALOG) 0.1 % Apply 1 Application topically 2 (two) times daily. 30 g 0   Vitamin D, Ergocalciferol, (DRISDOL) 1.25 MG (50000 UNIT) CAPS capsule Take 1 capsule (50,000 Units total) by mouth every 7 (seven) days. 12 capsule 3   No facility-administered medications prior to visit.    Past Medical History:  Diagnosis Date   Breast CA (HCC)    GERD (gastroesophageal reflux disease)    Hyperlipidemia    Hypertension    IBS (irritable bowel syndrome)    diarrhea      Objective:     LMP 01/18/2018          Assessment   No problem-specific Assessment & Plan notes found for this encounter.     Sandrea Hughs, MD 03/16/2024

## 2024-03-19 ENCOUNTER — Ambulatory Visit: Admitting: Internal Medicine

## 2024-04-02 ENCOUNTER — Ambulatory Visit: Admitting: Family Medicine

## 2024-04-27 NOTE — Progress Notes (Signed)
 Visit cancelled.

## 2024-05-06 ENCOUNTER — Ambulatory Visit: Admitting: Internal Medicine

## 2024-09-04 ENCOUNTER — Other Ambulatory Visit: Payer: Self-pay | Admitting: Family

## 2024-09-04 DIAGNOSIS — K219 Gastro-esophageal reflux disease without esophagitis: Secondary | ICD-10-CM

## 2024-09-24 ENCOUNTER — Encounter: Payer: Self-pay | Admitting: Family Medicine

## 2024-09-24 ENCOUNTER — Other Ambulatory Visit (HOSPITAL_COMMUNITY)
Admission: RE | Admit: 2024-09-24 | Discharge: 2024-09-24 | Disposition: A | Discharge status: Home / Self Care | Source: Ambulatory Visit | Attending: Family Medicine | Admitting: Family Medicine

## 2024-09-24 ENCOUNTER — Ambulatory Visit: Admitting: Family Medicine

## 2024-09-24 VITALS — BP 160/88 | HR 75 | Temp 97.3°F | Ht 62.5 in | Wt 196.4 lb

## 2024-09-24 DIAGNOSIS — L309 Dermatitis, unspecified: Secondary | ICD-10-CM

## 2024-09-24 DIAGNOSIS — N898 Other specified noninflammatory disorders of vagina: Secondary | ICD-10-CM | POA: Diagnosis not present

## 2024-09-24 LAB — WET PREP FOR TRICH, YEAST, CLUE
Clue Cell Exam: NEGATIVE
Trichomonas Exam: NEGATIVE
Yeast Exam: NEGATIVE

## 2024-09-24 MED ORDER — TRIAMCINOLONE ACETONIDE 0.1 % EX CREA: 1.0000 | TOPICAL_CREAM | Freq: Two times a day (BID) | CUTANEOUS | 0 refills | Status: AC

## 2024-09-24 NOTE — Progress Notes (Signed)
 Subjective:  Patient ID: Grace Gomez, female    DOB: [birth year], 58 y.o.   MRN: [id number]  Patient Care Team: Severa Rock HERO, FNP as PCP - General (Family Medicine) Berry Debby RAMAN, PA-C (Oncology) Darlean Ozell NOVAK, MD as Consulting Physician (Pulmonary Disease)   Chief Complaint:  Vaginal Discharge (Started a few weeks ago )   HPI: Grace Gomez is a 58 y.o. female presenting on 09/24/2024 for Vaginal Discharge (Started a few weeks ago )    Grace Gomez is a 58 year old female who presents with vaginal discharge.  She has been experiencing a colorless vaginal discharge that varies in amount. There is no associated itching, burning, or blood in the discharge, and she has not noticed any pattern.  She denies any new sexual partners and has not been sexually active for years. She mentions a slight odor when sitting or using the bathroom, which she attributes to sweating. She experiences frequent sweating even when sitting still.      Relevant past medical, surgical, family, and social history reviewed and updated as indicated.  Allergies and medications reviewed and updated. Data reviewed: Chart in Epic.   Past Medical History:  Diagnosis Date   Breast CA (HCC)    GERD (gastroesophageal reflux disease)    Hyperlipidemia    Hypertension    IBS (irritable bowel syndrome)    diarrhea    Past Surgical History:  Procedure Laterality Date   BREAST BIOPSY Right 2020   invasive ductal ca   BREAST EXCISIONAL BIOPSY Right 2020   invaseive ductal CA   BREAST LUMPECTOMY     HERNIA REPAIR     TUBAL LIGATION  1990    Social History   Socioeconomic History   Marital status: Single    Spouse name: Not on file   Number of children: 3   Years of education: Not on file   Highest education level: Not on file  Occupational History   Occupation: Single Charity fundraiser  Tobacco Use   Smoking status: Former    Current packs/day: 0.00    Average packs/day: 1 pack/day for  21.1 years (21.1 ttl pk-yrs)    Types: Cigarettes    Start date: 07/06/1980    Quit date: 08/20/2001    Years since quitting: 23.1   Smokeless tobacco: Never  Vaping Use   Vaping status: Never Used  Substance and Sexual Activity   Alcohol use: No   Drug use: No   Sexual activity: Not on file  Other Topics Concern   Not on file  Social History Narrative   Not on file   Social Drivers of Health   Financial Resource Strain: Low Risk  (11/11/2019)   Received from [facility]   Overall Financial Resource Strain (CARDIA)    Difficulty of Paying Living Expenses: Not hard at all  Food Insecurity: No Food Insecurity (05/03/2022)   Received from [facility]   Hunger Vital Sign    Within the past 12 months, you worried that your food would run out before you got the money to buy more.: Never true    Within the past 12 months, the food you bought just didn't last and you didn't have money to get more.: Never true  Transportation Needs: Unknown (11/11/2019)   Received from [facility] - Transportation    Lack of Transportation (Medical): Patient declined    Lack of Transportation (Non-Medical): Patient declined  Physical Activity: Unknown (11/11/2019)  Received from [facility]   Exercise Vital Sign    On average, how many days per week do you engage in moderate to strenuous exercise (like a brisk walk)?: Patient declined    On average, how many minutes do you engage in exercise at this level?: Patient declined  Stress: No Stress Concern Present (11/11/2019)   Received from [facility] - Occupational Stress Questionnaire    Feeling of Stress : Only a little  Social Connections: Unknown (04/20/2022)   Received from [facility]   Social Network    Social Network: Not on file  Intimate Partner Violence: Not At Risk (09/07/2022)   Received from [facility]   Humiliation, Afraid, Rape, and Kick questionnaire    Within  the last year, have you been afraid of your partner or ex-partner?: No    Within the last year, have you been humiliated or emotionally abused in other ways by your partner or ex-partner?: No    Within the last year, have you been kicked, hit, slapped, or otherwise physically hurt by your partner or ex-partner?: No    Within the last year, have you been raped or forced to have any kind of sexual activity by your partner or ex-partner?: No    Outpatient Encounter Medications as of 09/24/2024  Medication Sig   dexlansoprazole  (DEXILANT ) 60 MG capsule Take 1 capsule by mouth once daily   Vitamin D , Ergocalciferol , (DRISDOL ) 1.25 MG (50000 UNIT) CAPS capsule Take 1 capsule (50,000 Units total) by mouth every 7 (seven) days.   [DISCONTINUED] triamcinolone  cream (KENALOG ) 0.1 % Apply 1 Application topically 2 (two) times daily.   triamcinolone  cream (KENALOG ) 0.1 % Apply 1 Application topically 2 (two) times daily.   [DISCONTINUED] ciprofloxacin  (CIPRO ) 250 MG tablet SMARTSIG:1 Tablet(s) By Mouth Every 12 Hours   No facility-administered encounter medications on file as of 09/24/2024.    Allergies  Allergen Reactions   Prednisone  Nausea And Vomiting   Rosuvastatin Other (See Comments)   Sulfa Antibiotics Hives    Pertinent ROS per HPI, otherwise unremarkable      Objective:  BP (!) 160/88   Pulse 75   Temp (!) 97.3 F (36.3 C)   Ht 5' 2.5 (1.588 m)   Wt 196 lb 6.4 oz (89.1 kg)   LMP 01/18/2018   SpO2 92%   BMI 35.35 kg/m    Wt Readings from Last 3 Encounters:  09/24/24 196 lb 6.4 oz (89.1 kg)  02/20/24 188 lb 9.6 oz (85.5 kg)  02/06/24 189 lb (85.7 kg)    Physical Exam Vitals and nursing note reviewed.  Constitutional:      General: She is not in acute distress.    Appearance: Normal appearance. She is obese. She is not ill-appearing, toxic-appearing or diaphoretic.  HENT:     Head: Normocephalic and atraumatic.     Mouth/Throat:     Mouth: Mucous membranes are moist.   Eyes:     Pupils: Pupils are equal, round, and reactive to light.  Cardiovascular:     Rate and Rhythm: Normal rate and regular rhythm.  Pulmonary:     Effort: Pulmonary effort is normal.  Abdominal:     General: Bowel sounds are normal.     Palpations: Abdomen is soft.     Tenderness: There is no abdominal tenderness. There is no right CVA tenderness or left CVA tenderness.  Genitourinary:    Comments: Self wetprep Musculoskeletal:  Cervical back: Neck supple.     Right lower leg: No edema.     Left lower leg: No edema.  Skin:    General: Skin is warm and dry.     Capillary Refill: Capillary refill takes less than 2 seconds.     Findings: Rash present.  Neurological:     General: No focal deficit present.     Mental Status: She is alert and oriented to person, place, and time.  Psychiatric:        Mood and Affect: Mood normal.        Behavior: Behavior normal.        Thought Content: Thought content normal.        Judgment: Judgment normal.     Results for orders placed or performed in visit on 02/20/24  Bayer DCA Hb A1c Waived   Collection Time: 02/20/24 10:37 AM  Result Value Ref Range   HB A1C (BAYER DCA - WAIVED) 5.9 (H) 4.8 - 5.6 %  Anemia Profile B   Collection Time: 02/20/24 10:39 AM  Result Value Ref Range   Total Iron Binding Capacity 262 250 - 450 ug/dL   UIBC 819 868 - 574 ug/dL   Iron 82 27 - 840 ug/dL   Iron Saturation 31 15 - 55 %   Ferritin 180 (H) 15 - 150 ng/mL   Vitamin B-12 591 232 - 1,245 pg/mL   Folate 7.4 >3.0 ng/mL   WBC 7.0 3.4 - 10.8 x10E3/uL   RBC 4.62 3.77 - 5.28 x10E6/uL   Hemoglobin 13.9 11.1 - 15.9 g/dL   Hematocrit 57.3 65.9 - 46.6 %   MCV 92 79 - 97 fL   MCH 30.1 26.6 - 33.0 pg   MCHC 32.6 31.5 - 35.7 g/dL   RDW 87.0 88.2 - 84.5 %   Platelets 294 150 - 450 x10E3/uL   Neutrophils 66 Not Estab. %   Lymphs 26 Not Estab. %   Monocytes 5 Not Estab. %   Eos 2 Not Estab. %   Basos 1 Not Estab. %   Neutrophils Absolute 4.6 1.4  - 7.0 x10E3/uL   Lymphocytes Absolute 1.8 0.7 - 3.1 x10E3/uL   Monocytes Absolute 0.4 0.1 - 0.9 x10E3/uL   EOS (ABSOLUTE) 0.2 0.0 - 0.4 x10E3/uL   Basophils Absolute 0.1 0.0 - 0.2 x10E3/uL   Immature Granulocytes 0 Not Estab. %   Immature Grans (Abs) 0.0 0.0 - 0.1 x10E3/uL   Retic Ct Pct 1.1 0.6 - 2.6 %  CMP14+EGFR   Collection Time: 02/20/24 10:39 AM  Result Value Ref Range   Glucose 87 70 - 99 mg/dL   BUN 18 6 - 24 mg/dL   Creatinine, Ser 9.30 0.57 - 1.00 mg/dL   eGFR 899 >40 fO/fpw/8.26   BUN/Creatinine Ratio 26 (H) 9 - 23   Sodium 141 134 - 144 mmol/L   Potassium 4.8 3.5 - 5.2 mmol/L   Chloride 105 96 - 106 mmol/L   CO2 24 20 - 29 mmol/L   Calcium  9.8 8.7 - 10.2 mg/dL   Total Protein 6.7 6.0 - 8.5 g/dL   Albumin 4.1 3.8 - 4.9 g/dL   Globulin, Total 2.6 1.5 - 4.5 g/dL   Bilirubin Total 0.4 0.0 - 1.2 mg/dL   Alkaline Phosphatase 116 44 - 121 IU/L   AST 21 0 - 40 IU/L   ALT 18 0 - 32 IU/L  Lipid panel   Collection Time: 02/20/24 10:39 AM  Result Value Ref Range  Cholesterol, Total 203 (H) 100 - 199 mg/dL   Triglycerides 844 (H) 0 - 149 mg/dL   HDL 46 >60 mg/dL   VLDL Cholesterol Cal 28 5 - 40 mg/dL   LDL Chol Calc (NIH) 870 (H) 0 - 99 mg/dL   Chol/HDL Ratio 4.4 0.0 - 4.4 ratio  Thyroid  Panel With TSH   Collection Time: 02/20/24 10:39 AM  Result Value Ref Range   TSH 0.983 0.450 - 4.500 uIU/mL   T4, Total 8.8 4.5 - 12.0 ug/dL   T3 Uptake Ratio 25 24 - 39 %   Free Thyroxine Index 2.2 1.2 - 4.9  VITAMIN D  25 Hydroxy (Vit-D Deficiency, Fractures)   Collection Time: 02/20/24 10:39 AM  Result Value Ref Range   Vit D, 25-Hydroxy 39.3 30.0 - 100.0 ng/mL       Pertinent labs & imaging results that were available during my care of the patient were reviewed by me and considered in my medical decision making.  Assessment & Plan:  [place] was seen today for vaginal discharge.  Diagnoses and all orders for this visit:  Vaginal discharge -     WET PREP FOR TRICH, YEAST,  CLUE -     Urine cytology ancillary only  Dermatitis -     triamcinolone  cream (KENALOG ) 0.1 %; Apply 1 Application topically 2 (two) times daily.      Vaginal discharge Intermittent vaginal discharge without color, odor, or associated symptoms such as itching, burning, or pain. Wet prep negative for yeast, bacterial vaginosis, and trichomonas. Likely due to normal vaginal flora. No recent sexual activity. Sweating possibly related to hormonal changes post-breast cancer treatment. - Order urine cytology to rule out missed infections - Advise to report worsening symptoms such as bleeding, fever, or chills  Dermatitis Ongoing management of dermatitis with triamcinolone  cream. - Prescribe triamcinolone  cream refill          Continue all other maintenance medications.  Follow up plan: Return if symptoms worsen or fail to improve.   Continue healthy lifestyle choices, including diet (rich in fruits, vegetables, and lean proteins, and low in salt and simple carbohydrates) and exercise (at least 30 minutes of moderate physical activity daily).    The above assessment and management plan was discussed with the patient. The patient verbalized understanding of and has agreed to the management plan. Patient is aware to call the clinic if they develop any new symptoms or if symptoms persist or worsen. Patient is aware when to return to the clinic for a follow-up visit. Patient educated on when it is appropriate to go to the emergency department.   Rosaline Bruns, FNP-C [facility] [phone]

## 2024-09-25 ENCOUNTER — Ambulatory Visit: Payer: Self-pay | Admitting: Family Medicine

## 2024-09-25 LAB — URINE CYTOLOGY ANCILLARY ONLY
Chlamydia: NEGATIVE
Comment: NEGATIVE
Comment: NEGATIVE
Comment: NORMAL
Neisseria Gonorrhea: NEGATIVE
Trichomonas: NEGATIVE

## 2024-10-31 ENCOUNTER — Encounter: Payer: Self-pay | Admitting: Family Medicine

## 2024-10-31 ENCOUNTER — Ambulatory Visit (HOSPITAL_COMMUNITY)
Admission: RE | Admit: 2024-10-31 | Discharge: 2024-10-31 | Disposition: A | Discharge status: Home / Self Care | Source: Ambulatory Visit | Attending: Family Medicine | Admitting: Family Medicine

## 2024-10-31 ENCOUNTER — Ambulatory Visit: Payer: Self-pay | Admitting: Family Medicine

## 2024-10-31 ENCOUNTER — Ambulatory Visit: Admitting: Family Medicine

## 2024-10-31 VITALS — BP 157/93 | HR 71 | Temp 95.7°F | Ht 62.5 in | Wt 195.2 lb

## 2024-10-31 DIAGNOSIS — R1024 Suprapubic pain: Secondary | ICD-10-CM | POA: Insufficient documentation

## 2024-10-31 DIAGNOSIS — R030 Elevated blood-pressure reading, without diagnosis of hypertension: Secondary | ICD-10-CM | POA: Diagnosis not present

## 2024-10-31 DIAGNOSIS — R062 Wheezing: Secondary | ICD-10-CM

## 2024-10-31 DIAGNOSIS — Z853 Personal history of malignant neoplasm of breast: Secondary | ICD-10-CM | POA: Diagnosis not present

## 2024-10-31 DIAGNOSIS — N898 Other specified noninflammatory disorders of vagina: Secondary | ICD-10-CM | POA: Insufficient documentation

## 2024-10-31 DIAGNOSIS — B3731 Acute candidiasis of vulva and vagina: Secondary | ICD-10-CM

## 2024-10-31 DIAGNOSIS — R1031 Right lower quadrant pain: Secondary | ICD-10-CM | POA: Diagnosis not present

## 2024-10-31 LAB — WET PREP FOR TRICH, YEAST, CLUE
Clue Cell Exam: NEGATIVE
Trichomonas Exam: NEGATIVE
Yeast Exam: POSITIVE — AB

## 2024-10-31 LAB — MICROSCOPIC EXAMINATION
Bacteria, UA: NONE SEEN
RBC, Urine: NONE SEEN /HPF (ref 0–2)
Renal Epithel, UA: NONE SEEN /HPF

## 2024-10-31 LAB — URINALYSIS, ROUTINE W REFLEX MICROSCOPIC
Bilirubin, UA: NEGATIVE
Glucose, UA: NEGATIVE
Ketones, UA: NEGATIVE
Nitrite, UA: NEGATIVE
Protein,UA: NEGATIVE
RBC, UA: NEGATIVE
Specific Gravity, UA: 1.02 (ref 1.005–1.030)
Urobilinogen, Ur: 0.2 mg/dL (ref 0.2–1.0)
pH, UA: 7.5 (ref 5.0–7.5)

## 2024-10-31 MED ORDER — ALBUTEROL SULFATE HFA 108 (90 BASE) MCG/ACT IN AERS: 2.0000 | INHALATION_SPRAY | Freq: Four times a day (QID) | RESPIRATORY_TRACT | 0 refills | Status: AC | PRN

## 2024-10-31 MED ORDER — FLUCONAZOLE 150 MG PO TABS: 150.0000 mg | ORAL_TABLET | Freq: Once | ORAL | 0 refills | Status: AC

## 2024-10-31 NOTE — Progress Notes (Signed)
 Subjective:  Patient ID: Grace Gomez, female    DOB: 1966-12-01, 58 y.o.   MRN: [id number]  Patient Care Team: Severa Rock HERO, FNP as PCP - General (Family Medicine) Berry, Debby RAMAN, PA-C (Oncology) Darlean Ozell NOVAK, MD as Consulting Physician (Pulmonary Disease)   Chief Complaint:  Vaginal Discharge (Patient states it never improved from the last visit. )   HPI: Grace Gomez is a 58 y.o. female presenting on 10/31/2024 for Vaginal Discharge (Patient states it never improved from the last visit. )   Grace Gomez is a 58 year old female who presents with lower abdominal pain and vaginal discharge.  She experiences intermittent lower abdominal pain, described as 'ovaries hurting,' located in the lower abdomen, sometimes severe enough to impede walking. She also has lower back problems, which she speculates might be related.  She reports a clear vaginal discharge with a slight odor, without itching, burning, or redness. A previous workup, including a wet prep, showed no abnormalities.  She has a history of breast cancer and is concerned about the possibility of recurrence.  She experiences occasional shortness of breath and chest tightness, especially during physical exertion. She previously used inhalers and a breathing machine due to frequent bronchitis but has not had such issues recently. She is scheduled to see pulmonology.   Her blood pressure has been high during recent visits. She has not been checking it consistently since her last visit.          Relevant past medical, surgical, family, and social history reviewed and updated as indicated.  Allergies and medications reviewed and updated. Data reviewed: Chart in Epic.   Past Medical History:  Diagnosis Date   Breast CA (HCC)    GERD (gastroesophageal reflux disease)    Hyperlipidemia    Hypertension    IBS (irritable bowel syndrome)    diarrhea    Past Surgical History:  Procedure Laterality Date   BREAST  BIOPSY Right 2020   invasive ductal ca   BREAST EXCISIONAL BIOPSY Right 2020   invaseive ductal CA   BREAST LUMPECTOMY     HERNIA REPAIR     TUBAL LIGATION  1990    Social History   Socioeconomic History   Marital status: Single    Spouse name: Not on file   Number of children: 3   Years of education: Not on file   Highest education level: Not on file  Occupational History   Occupation: Single charity fundraiser  Tobacco Use   Smoking status: Former    Current packs/day: 0.00    Average packs/day: 1 pack/day for 21.1 years (21.1 ttl pk-yrs)    Types: Cigarettes    Start date: 07/06/1980    Quit date: 08/20/2001    Years since quitting: 23.2   Smokeless tobacco: Never  Vaping Use   Vaping status: Never Used  Substance and Sexual Activity   Alcohol use: No   Drug use: No   Sexual activity: Not on file  Other Topics Concern   Not on file  Social History Narrative   Not on file   Social Drivers of Health   Financial Resource Strain: Low Risk (11/11/2019)   Received from [facility]   Overall Financial Resource Strain (CARDIA)    Difficulty of Paying Living Expenses: Not hard at all  Food Insecurity: No Food Insecurity (05/03/2022)   Received from [facility]   Hunger Vital Sign    Within the past 12 months, you worried that  your food would run out before you got the money to buy more.: Never true    Within the past 12 months, the food you bought just didn't last and you didn't have money to get more.: Never true  Transportation Needs: Unknown (11/11/2019)   Received from [facility] - Transportation    Lack of Transportation (Medical): Patient declined    Lack of Transportation (Non-Medical): Patient declined  Physical Activity: Unknown (11/11/2019)   Received from [facility]   Exercise Vital Sign    On average, how many days per week do you engage in moderate to strenuous exercise (like a brisk walk)?: Patient declined    On average, how many  minutes do you engage in exercise at this level?: Patient declined  Stress: No Stress Concern Present (11/11/2019)   Received from [facility] - Occupational Stress Questionnaire    Feeling of Stress : Only a little  Social Connections: Unknown (11/11/2019)   Received from [facility]   Social Connection and Isolation Panel    In a typical week, how many times do you talk on the phone with family, friends, or neighbors?: Patient declined    How often do you get together with friends or relatives?: Patient declined    How often do you attend church or religious services?: Patient declined    Do you belong to any clubs or organizations such as church groups, unions, fraternal or athletic groups, or school groups?: Patient declined    How often do you attend meetings of the clubs or organizations you belong to?: Patient declined    Are you married, widowed, divorced, separated, never married, or living with a partner?: Patient declined  Intimate Partner Violence: Not At Risk (09/07/2022)   Received from [facility]   Humiliation, Afraid, Rape, and Kick questionnaire    Within the last year, have you been afraid of your partner or ex-partner?: No    Within the last year, have you been humiliated or emotionally abused in other ways by your partner or ex-partner?: No    Within the last year, have you been kicked, hit, slapped, or otherwise physically hurt by your partner or ex-partner?: No    Within the last year, have you been raped or forced to have any kind of sexual activity by your partner or ex-partner?: No    Outpatient Encounter Medications as of 10/31/2024  Medication Sig   albuterol  (VENTOLIN  HFA) 108 (90 Base) MCG/ACT inhaler Inhale 2 puffs into the lungs every 6 (six) hours as needed for wheezing or shortness of breath.   dexlansoprazole  (DEXILANT ) 60 MG capsule Take 1 capsule by mouth once daily   fluconazole  (DIFLUCAN ) 150 MG  tablet Take 1 tablet (150 mg total) by mouth once for 1 dose.   triamcinolone  cream (KENALOG ) 0.1 % Apply 1 Application topically 2 (two) times daily.   Vitamin D , Ergocalciferol , (DRISDOL ) 1.25 MG (50000 UNIT) CAPS capsule Take 1 capsule (50,000 Units total) by mouth every 7 (seven) days.   No facility-administered encounter medications on file as of 10/31/2024.    Allergies  Allergen Reactions   Prednisone  Nausea And Vomiting   Rosuvastatin Other (See Comments)   Sulfa Antibiotics Hives    Pertinent ROS per HPI, otherwise unremarkable      Objective:  BP (!) 157/93   Pulse 71   Temp (!) 95.7 F (35.4 C)   Ht 5' 2.5 (1.588  m)   Wt 195 lb 3.2 oz (88.5 kg)   LMP 01/18/2018   SpO2 93%   BMI 35.13 kg/m    Wt Readings from Last 3 Encounters:  10/31/24 195 lb 3.2 oz (88.5 kg)  09/24/24 196 lb 6.4 oz (89.1 kg)  02/20/24 188 lb 9.6 oz (85.5 kg)    Physical Exam Vitals and nursing note reviewed.  Constitutional:      General: She is not in acute distress.    Appearance: Normal appearance. She is well-developed and well-groomed. She is obese. She is not ill-appearing, toxic-appearing or diaphoretic.  HENT:     Head: Normocephalic and atraumatic.     Jaw: There is normal jaw occlusion.     Right Ear: Hearing normal.     Left Ear: Hearing normal.     Nose: Nose normal.     Mouth/Throat:     Lips: Pink.     Mouth: Mucous membranes are moist.     Pharynx: Oropharynx is clear. Uvula midline.  Eyes:     General: Lids are normal.     Extraocular Movements: Extraocular movements intact.     Conjunctiva/sclera: Conjunctivae normal.     Pupils: Pupils are equal, round, and reactive to light.  Neck:     Thyroid : No thyroid  mass, thyromegaly or thyroid  tenderness.     Vascular: No carotid bruit or JVD.     Trachea: Trachea and phonation normal.  Cardiovascular:     Rate and Rhythm: Normal rate and regular rhythm.     Chest Wall: PMI is not displaced.     Pulses: Normal  pulses.     Heart sounds: Normal heart sounds. No murmur heard.    No friction rub. No gallop.  Pulmonary:     Effort: Pulmonary effort is normal. No respiratory distress.     Breath sounds: Wheezing present.  Abdominal:     General: Bowel sounds are normal. There is no distension or abdominal bruit.     Palpations: Abdomen is soft. There is no hepatomegaly or splenomegaly.     Tenderness: There is no abdominal tenderness. There is no right CVA tenderness or left CVA tenderness.     Hernia: No hernia is present.  Musculoskeletal:        General: Normal range of motion.     Cervical back: Normal range of motion and neck supple.     Right lower leg: No edema.     Left lower leg: No edema.  Lymphadenopathy:     Cervical: No cervical adenopathy.  Skin:    General: Skin is warm and dry.     Capillary Refill: Capillary refill takes less than 2 seconds.     Coloration: Skin is not cyanotic, jaundiced or pale.     Findings: No rash.  Neurological:     General: No focal deficit present.     Mental Status: She is alert and oriented to person, place, and time.     Sensory: Sensation is intact.     Motor: Motor function is intact.     Coordination: Coordination is intact.     Gait: Gait is intact.     Deep Tendon Reflexes: Reflexes are normal and symmetric.  Psychiatric:        Attention and Perception: Attention and perception normal.        Mood and Affect: Mood and affect normal.        Speech: Speech normal.        Behavior: Behavior  normal. Behavior is cooperative.        Thought Content: Thought content normal.        Cognition and Memory: Cognition and memory normal.        Judgment: Judgment normal.     Results for orders placed or performed in visit on 09/24/24  WET PREP FOR TRICH, YEAST, CLUE   Collection Time: 09/24/24 10:25 AM   Specimen: Vaginal Swab   Vaginal Swab  Result Value Ref Range   Trichomonas Exam Negative Negative   Yeast Exam Negative Negative   Clue  Cell Exam Negative Negative  Urine cytology ancillary only   Collection Time: 09/24/24 10:33 AM  Result Value Ref Range   Neisseria Gonorrhea Negative    Chlamydia Negative    Trichomonas Negative    Comment Normal Reference Ranger Chlamydia - Negative    Comment      Normal Reference Range Neisseria Gonorrhea - Negative   Comment Normal Reference Range Trichomonas - Negative        Pertinent labs & imaging results that were available during my care of the patient were reviewed by me and considered in my medical decision making.  Assessment & Plan:  Armya was seen today for vaginal discharge.  Diagnoses and all orders for this visit:  Vaginal discharge -     WET PREP FOR TRICH, YEAST, CLUE -     Urine Culture -     Urinalysis, Routine w reflex microscopic -     US  Pelvis Complete; Future  Suprapubic pain -     Urine Culture -     Urinalysis, Routine w reflex microscopic -     US  Pelvis Complete; Future  Yeast vaginitis -     fluconazole  (DIFLUCAN ) 150 MG tablet; Take 1 tablet (150 mg total) by mouth once for 1 dose.  History of right breast cancer -     US  Pelvis Complete; Future  Wheezing -     albuterol  (VENTOLIN  HFA) 108 (90 Base) MCG/ACT inhaler; Inhale 2 puffs into the lungs every 6 (six) hours as needed for wheezing or shortness of breath.  Elevated blood pressure reading in office without diagnosis of hypertension -     CMP14+EGFR -     CBC with Differential/Platelet -     TSH -     T4, Free -     Lipid panel       Acute vulvovaginal candidiasis Confirmed by KOH test showing yeast. Symptoms include clear discharge with some odor, but no itching, burning, or redness. - Prescribed Diflucan  (fluconazole ) for yeast infection  Chronic lower abdominal and pelvic pain Intermittent lower abdominal and pelvic pain, possibly related to ovarian issues. Differential includes bladder infection. Breast cancer survivor status noted. - Ordered pelvic ultrasound to  evaluate for ovarian masses or other abnormalities - Performed urinalysis to rule out bladder infection  Wheezing and possible chronic lower respiratory disease Intermittent wheezing and chest tightness. Previous chest CT showed possible emphysema. No current inhaler use due to past allergies to prednisone . - Prescribed albuterol  inhaler for wheezing, cough, or shortness of breath - Referred to pulmonologist for further evaluation and management  Elevated blood pressure, not yet diagnosed as hypertension Elevated blood pressure readings noted during visits. No recent home monitoring reported. - Ordered blood work to assess overall health - Advised regular home blood pressure monitoring - Scheduled follow-up in six weeks to review blood pressure readings and consider initiation of antihypertensive medication  Personal history of malignant neoplasm of  breast Breast cancer survivor status noted.          Continue all other maintenance medications.  Follow up plan: Return if symptoms worsen or fail to improve.   Continue healthy lifestyle choices, including diet (rich in fruits, vegetables, and lean proteins, and low in salt and simple carbohydrates) and exercise (at least 30 minutes of moderate physical activity daily).   The above assessment and management plan was discussed with the patient. The patient verbalized understanding of and has agreed to the management plan. Patient is aware to call the clinic if they develop any new symptoms or if symptoms persist or worsen. Patient is aware when to return to the clinic for a follow-up visit. Patient educated on when it is appropriate to go to the emergency department.   Rosaline Bruns, FNP-C [facility] [phone]

## 2024-11-01 LAB — CBC WITH DIFFERENTIAL/PLATELET
Basophils Absolute: 0.1 x10E3/uL (ref 0.0–0.2)
Basos: 1 %
EOS (ABSOLUTE): 0.3 x10E3/uL (ref 0.0–0.4)
Eos: 4 %
Hematocrit: 44.1 % (ref 34.0–46.6)
Hemoglobin: 14.4 g/dL (ref 11.1–15.9)
Immature Grans (Abs): 0 x10E3/uL (ref 0.0–0.1)
Immature Granulocytes: 0 %
Lymphocytes Absolute: 1.5 x10E3/uL (ref 0.7–3.1)
Lymphs: 21 %
MCH: 29.6 pg (ref 26.6–33.0)
MCHC: 32.7 g/dL (ref 31.5–35.7)
MCV: 91 fL (ref 79–97)
Monocytes Absolute: 0.5 x10E3/uL (ref 0.1–0.9)
Monocytes: 7 %
Neutrophils Absolute: 4.8 x10E3/uL (ref 1.4–7.0)
Neutrophils: 67 %
Platelets: 257 x10E3/uL (ref 150–450)
RBC: 4.86 x10E6/uL (ref 3.77–5.28)
RDW: 12.6 % (ref 11.7–15.4)
WBC: 7.2 x10E3/uL (ref 3.4–10.8)

## 2024-11-01 LAB — CMP14+EGFR
ALT: 18 IU/L (ref 0–32)
AST: 18 IU/L (ref 0–40)
Albumin: 4.1 g/dL (ref 3.8–4.9)
Alkaline Phosphatase: 115 IU/L (ref 49–135)
BUN/Creatinine Ratio: 20 (ref 12–28)
BUN: 14 mg/dL (ref 8–27)
Bilirubin Total: 0.3 mg/dL (ref 0.0–1.2)
CO2: 23 mmol/L (ref 20–29)
Calcium: 9.4 mg/dL (ref 8.7–10.3)
Chloride: 103 mmol/L (ref 96–106)
Creatinine, Ser: 0.71 mg/dL (ref 0.57–1.00)
Globulin, Total: 2.5 g/dL (ref 1.5–4.5)
Glucose: 82 mg/dL (ref 70–99)
Potassium: 4.5 mmol/L (ref 3.5–5.2)
Sodium: 140 mmol/L (ref 134–144)
Total Protein: 6.6 g/dL (ref 6.0–8.5)
eGFR: 97 mL/min/1.73 (ref >59)

## 2024-11-01 LAB — LIPID PANEL
Chol/HDL Ratio: 5 ratio — ABNORMAL HIGH (ref 0.0–4.4)
Cholesterol, Total: 227 mg/dL — ABNORMAL HIGH (ref 100–199)
HDL: 45 mg/dL (ref >39)
LDL Chol Calc (NIH): 147 mg/dL — ABNORMAL HIGH (ref 0–99)
Triglycerides: 193 mg/dL — ABNORMAL HIGH (ref 0–149)
VLDL Cholesterol Cal: 35 mg/dL (ref 5–40)

## 2024-11-01 LAB — TSH: TSH: 1.5 u[IU]/mL (ref 0.450–4.500)

## 2024-11-01 LAB — T4, FREE: Free T4: 1.28 ng/dL (ref 0.82–1.77)

## 2024-11-02 LAB — URINE CULTURE

## 2024-11-16 ENCOUNTER — Other Ambulatory Visit: Payer: Self-pay | Admitting: Family

## 2024-11-16 DIAGNOSIS — E559 Vitamin D deficiency, unspecified: Secondary | ICD-10-CM

## 2024-11-17 ENCOUNTER — Ambulatory Visit: Admitting: Internal Medicine

## 2024-11-17 ENCOUNTER — Encounter: Payer: Self-pay | Admitting: Internal Medicine

## 2024-11-17 VITALS — BP 137/85 | HR 65 | Ht 62.5 in | Wt 197.0 lb

## 2024-11-17 DIAGNOSIS — J432 Centrilobular emphysema: Secondary | ICD-10-CM | POA: Insufficient documentation

## 2024-11-17 NOTE — Progress Notes (Signed)
 Grace Gomez, female    DOB: [birth year]    MRN: [id number]   Brief patient profile:  58  yowf  quit 2003  referred to pulmonary clinic in [place]  11/17/2024 by Rock Bruns FNP  for  doe/ wheezing  x 2014 improved p quit but worsened again around fall of 2024 but only using prn saba p exertion as of initial pulm eval   Pt not previously seen by PCCM service.     History of Present Illness  11/17/2024  Pulmonary/ 1st office eval/ Kandace Elrod / [place] [facility]  Chief Complaint  Patient presents with   Establish Care    Wheezing - DOE  Abn ct show emphysema    Dyspnea:  yard work, not doing much heavy  exertion or regular walking/ ex (afraid to due to doe) assoc with some wheezing better p saba  Cough: none  Sleep:  flat bed with 2 pillows  SABA use: rarely and usually p ex  02: none  Cp not exertional /  assoc HBP, generalized ant discomfort s radiation/ n v or diaphoresis  No obvious day to day or daytime pattern/variability or assoc excess/ purulent sputum or mucus plugs or hemoptysis or subjective wheeze or overt sinus  symptoms.    Also denies any obvious fluctuation of symptoms with weather or environmental changes or other aggravating or alleviating factors except as outlined above   No unusual exposure hx or h/o childhood pna/ asthma or knowledge of premature birth.  Current Allergies, Complete Past Medical History, Past Surgical History, Family History, and Social History were reviewed in [employer] record.  ROS  The following are not active complaints unless bolded Hoarseness, sore throat, dysphagia, dental problems, itching, sneezing,  nasal congestion or discharge of excess mucus or purulent secretions, ear ache,   fever, chills, sweats, unintended wt loss or wt gain, classically pleuritic or exertional cp,  orthopnea pnd or arm/hand swelling  or leg swelling, presyncope, palpitations, abdominal pain, anorexia, nausea, vomiting, diarrhea  or change in  bowel habits or change in bladder habits, change in stools or change in urine, dysuria, hematuria,  rash, arthralgias, visual complaints, headache, numbness, weakness or ataxia or problems with walking or coordination,  change in mood or  memory.            Outpatient Medications Prior to Visit  Medication Sig Dispense Refill   albuterol  (VENTOLIN  HFA) 108 (90 Base) MCG/ACT inhaler Inhale 2 puffs into the lungs every 6 (six) hours as needed for wheezing or shortness of breath. 8 g 0   dexlansoprazole  (DEXILANT ) 60 MG capsule Take 1 capsule by mouth once daily 90 capsule 1   triamcinolone  cream (KENALOG ) 0.1 % Apply 1 Application topically 2 (two) times daily. 30 g 0   Vitamin D , Ergocalciferol , (DRISDOL ) 1.25 MG (50000 UNIT) CAPS capsule Take 1 capsule (50,000 Units total) by mouth every 7 (seven) days. 12 capsule 3   No facility-administered medications prior to visit.    Past Medical History:  Diagnosis Date   Breast CA (HCC)    GERD (gastroesophageal reflux disease)    Hyperlipidemia    Hypertension    IBS (irritable bowel syndrome)    diarrhea      Objective:     BP 137/85   Pulse 65   Ht 5' 2.5 (1.588 m)   Wt 197 lb (89.4 kg)   LMP 01/18/2018   SpO2 96% Comment: ra  BMI 35.46 kg/m   SpO2: 96 % (ra)  Amb pleasant wf nad   HEENT : Oropharynx  clear   Nasal turbinates nl    NECK :  without  apparent JVD/ palpable Nodes/TM    LUNGS: no acc muscle use,  Min barrel  contour chest wall with bilateral  slightly decreased bs s audible wheeze and  without cough on insp or exp maneuvers and min  Hyperresonant  to  percussion bilaterally    CV:  RRR  no s3 or murmur or increase in P2, and no edema   ABD:  soft and nontender    MS:  Nl gait/ ext warm without deformities Or obvious joint restrictions  calf tenderness, cyanosis or clubbing     SKIN: warm and dry without lesions    NEURO:  alert, approp, nl sensorium with  no motor or cerebellar deficits apparent.         I personally reviewed images and agree with radiology impression as follows:   Chest CTw contrast 01/11/24   -bronchial tree is patent. Patchy  emphysematous blebs noted.  No nodules       Assessment   Assessment & Plan Centrilobular emphysema (HCC) Quit smoking in 2003  - Allergy screen 11/17/2024 >  Eos 0. /  alpha one AT phenotype  - 11/17/2024   Walked on RA  x  3  lap(s) =  approx 450  ft  @ mod to fast pace, stopped due to end of study  with lowest 02 sats 93% but improved to 95% before stopped with min sob/ wheeze and no cp   - The proper method of use, as well as anticipated side effects, of a metered-dose inhaler were discussed and demonstrated to the patient using teach back method   I explained that emphysema is one cause of copd but since really only limited at higher level of exertion (which she doesn't presently pursue) nor have anything sounding like AB then other than maintaining off cigs all that is needed for now is a baseline set of PFTs and in there interim :  Re SABA :  I spent extra time with pt today reviewing appropriate use of albuterol  for prn use on exertion with the following points: 1) saba is for relief of sob that does not improve by walking a slower pace or resting but rather if the pt does not improve after trying this first. 2) If the pt is convinced, as many are, that saba helps recover from activity faster then it's easy to tell if this is the case by re-challenging : ie stop, take the inhaler, then p 5 minutes try the exact same activity (intensity of workload) that just caused the symptoms and see if they are substantially diminished or not after saba 3) if there is an activity that reproducibly causes the symptoms, try the saba 15 min before the activity on alternate days   If in fact the saba really does help, then fine to continue to use it prn but advised may need to look closer at the maintenance regimen being used to achieve better control of  airways disease with exertion.   F/u p pfts   with all meds in hand using a trust but verify approach to confirm accurate Medication  Reconciliation The principal here is that until we are certain that the  patients are doing what we've asked, it makes no sense to ask them to do more.   Each maintenance medication was reviewed in detail including emphasizing most importantly the difference between  maintenance and prns and under what circumstances the prns are to be triggered using an action plan format where appropriate.  Total time for H and P, chart review, counseling, reviewing hfa  device(s) , directly observing portions of ambulatory 02 saturation study/ and generating customized AVS unique to this office visit / same day charting = 45 min new pt eval                  AVS  Patient Instructions  Use your albuterol  as a rescue medication to be used if you can't catch your breath by resting, slowing your pace,  or doing a relaxed purse lip breathing pattern.  - The less you use it, the better it will work when you need it. - Ok to use up to 2 puffs  every 4 hours if you must but call for  appointment if use goes up over your usual need - Don't leave home without it !!  (think of it like a spare tire or starter fluid for your car)   Also  Ok to try albuterol  15 min before an activity (on alternating days)  that you know would usually make you short of breath and see if it makes any difference and if makes none then don't take albuterol  after activity unless you can't catch your breath as this means it's the resting that helps, not the albuterol   Please remember to go to the lab department   for your tests - we will call you with the results when they are available.      Please schedule a follow up office visit in 6-8 weeks, call sooner if needed with all medications /inhalers/ solutions in hand so we can verify exactly what you are taking. This includes all medications from all doctors and  over the counters with pfts on return       Ozell America, MD 11/17/2024

## 2024-11-17 NOTE — Assessment & Plan Note (Addendum)
 Quit smoking in 2003  - Allergy screen 11/17/2024 >  Eos 0. /  alpha one AT phenotype  - 11/17/2024   Walked on RA  x  3  lap(s) =  approx 450  ft  @ mod to fast pace, stopped due to end of study  with lowest 02 sats 93% but improved to 95% before stopped with min sob/ wheeze and no cp   - The proper method of use, as well as anticipated side effects, of a metered-dose inhaler were discussed and demonstrated to the patient using teach back method   I explained that emphysema is one cause of copd but since really only limited at higher level of exertion (which she doesn't presently pursue) nor have anything sounding like AB then other than maintaining off cigs all that is needed for now is a baseline set of PFTs and in there interim :  Re SABA :  I spent extra time with pt today reviewing appropriate use of albuterol  for prn use on exertion with the following points: 1) saba is for relief of sob that does not improve by walking a slower pace or resting but rather if the pt does not improve after trying this first. 2) If the pt is convinced, as many are, that saba helps recover from activity faster then it's easy to tell if this is the case by re-challenging : ie stop, take the inhaler, then p 5 minutes try the exact same activity (intensity of workload) that just caused the symptoms and see if they are substantially diminished or not after saba 3) if there is an activity that reproducibly causes the symptoms, try the saba 15 min before the activity on alternate days   If in fact the saba really does help, then fine to continue to use it prn but advised may need to look closer at the maintenance regimen being used to achieve better control of airways disease with exertion.   F/u p pfts   with all meds in hand using a trust but verify approach to confirm accurate Medication  Reconciliation The principal here is that until we are certain that the  patients are doing what we've asked, it makes no sense to  ask them to do more.   Each maintenance medication was reviewed in detail including emphasizing most importantly the difference between maintenance and prns and under what circumstances the prns are to be triggered using an action plan format where appropriate.  Total time for H and P, chart review, counseling, reviewing hfa  device(s) , directly observing portions of ambulatory 02 saturation study/ and generating customized AVS unique to this office visit / same day charting = 45 min new pt eval

## 2024-11-17 NOTE — Patient Instructions (Addendum)
 Use your albuterol  as a rescue medication to be used if you can't catch your breath by resting, slowing your pace,  or doing a relaxed purse lip breathing pattern.  - The less you use it, the better it will work when you need it. - Ok to use up to 2 puffs  every 4 hours if you must but call for  appointment if use goes up over your usual need - Don't leave home without it !!  (think of it like a spare tire or starter fluid for your car)   Also  Ok to try albuterol  15 min before an activity (on alternating days)  that you know would usually make you short of breath and see if it makes any difference and if makes none then don't take albuterol  after activity unless you can't catch your breath as this means it's the resting that helps, not the albuterol   Please remember to go to the lab department   for your tests - we will call you with the results when they are available.      Please schedule a follow up office visit in 6-8 weeks, call sooner if needed with all medications /inhalers/ solutions in hand so we can verify exactly what you are taking. This includes all medications from all doctors and over the counters with pfts on return

## 2024-11-23 ENCOUNTER — Ambulatory Visit: Payer: Self-pay | Admitting: Internal Medicine

## 2024-11-23 LAB — CBC WITH DIFFERENTIAL/PLATELET
Basophils Absolute: 0.1 x10E3/uL (ref 0.0–0.2)
Basos: 1 %
EOS (ABSOLUTE): 0.2 x10E3/uL (ref 0.0–0.4)
Eos: 3 %
Hematocrit: 43.1 % (ref 34.0–46.6)
Hemoglobin: 14 g/dL (ref 11.1–15.9)
Immature Grans (Abs): 0 x10E3/uL (ref 0.0–0.1)
Immature Granulocytes: 0 %
Lymphocytes Absolute: 1.8 x10E3/uL (ref 0.7–3.1)
Lymphs: 27 %
MCH: 29.7 pg (ref 26.6–33.0)
MCHC: 32.5 g/dL (ref 31.5–35.7)
MCV: 91 fL (ref 79–97)
Monocytes Absolute: 0.5 x10E3/uL (ref 0.1–0.9)
Monocytes: 7 %
Neutrophils Absolute: 4.2 x10E3/uL (ref 1.4–7.0)
Neutrophils: 62 %
Platelets: 246 x10E3/uL (ref 150–450)
RBC: 4.72 x10E6/uL (ref 3.77–5.28)
RDW: 12.8 % (ref 11.7–15.4)
WBC: 6.8 x10E3/uL (ref 3.4–10.8)

## 2024-11-23 LAB — ALPHA-1-ANTITRYPSIN PHENOTYP: A-1 Antitrypsin: 130 mg/dL (ref 101–187)

## 2024-11-24 ENCOUNTER — Other Ambulatory Visit: Payer: Self-pay

## 2024-11-24 DIAGNOSIS — J432 Centrilobular emphysema: Secondary | ICD-10-CM

## 2024-11-24 NOTE — Telephone Encounter (Signed)
 Copied from CRM #[id number]. Topic: Clinical - Lab/Test Results >> Nov 24, 2024  8:39 AM Grace Gomez wrote: Reason for CRM: Patient is requesting a call back from Dr. Leisa nurse regarding her lab results as she has further questions.  Per [facility] Pt scheduled for the 30th for OV and PFT

## 2024-11-24 NOTE — Progress Notes (Signed)
 Called and spoke with pt regarding her labs an she confirmed understanding but she needs to have PFT on return of visit of 12/30  - seeing if we can schedule one on that day and if not does dr wert still want to see her in office

## 2024-11-24 NOTE — Progress Notes (Signed)
 Atc x1 lmtcb

## 2024-11-28 ENCOUNTER — Other Ambulatory Visit: Payer: Self-pay | Admitting: Family

## 2024-11-28 DIAGNOSIS — Z1231 Encounter for screening mammogram for malignant neoplasm of breast: Secondary | ICD-10-CM

## 2024-12-01 ENCOUNTER — Inpatient Hospital Stay: Admission: RE | Admit: 2024-12-01 | Discharge: 2024-12-01 | Attending: Family Medicine

## 2024-12-01 DIAGNOSIS — Z1231 Encounter for screening mammogram for malignant neoplasm of breast: Secondary | ICD-10-CM

## 2024-12-02 ENCOUNTER — Ambulatory Visit (HOSPITAL_COMMUNITY)
Admission: RE | Admit: 2024-12-02 | Discharge: 2024-12-02 | Disposition: A | Discharge status: Home / Self Care | Source: Ambulatory Visit | Attending: Internal Medicine | Admitting: Internal Medicine

## 2024-12-02 DIAGNOSIS — J432 Centrilobular emphysema: Secondary | ICD-10-CM | POA: Insufficient documentation

## 2024-12-02 LAB — PULMONARY FUNCTION TEST
DL/VA % pred: 104 %
DL/VA: 4.48 ml/min/mmHg/L
DLCO cor % pred: 94 %
DLCO cor: 17.78 ml/min/mmHg
DLCO unc % pred: 95 %
DLCO unc: 18.1 ml/min/mmHg
FEF 25-75 Post: 2.8 L/s
FEF 25-75 Pre: 2.15 L/s
FEF2575-%Change-Post: 30 %
FEF2575-%Pred-Post: 125 %
FEF2575-%Pred-Pre: 95 %
FEV1-%Change-Post: 6 %
FEV1-%Pred-Post: 94 %
FEV1-%Pred-Pre: 88 %
FEV1-Post: 2.24 L
FEV1-Pre: 2.1 L
FEV1FVC-%Change-Post: 0 %
FEV1FVC-%Pred-Pre: 103 %
FEV6-%Change-Post: 6 %
FEV6-%Pred-Post: 93 %
FEV6-%Pred-Pre: 87 %
FEV6-Post: 2.76 L
FEV6-Pre: 2.59 L
FEV6FVC-%Pred-Post: 103 %
FEV6FVC-%Pred-Pre: 103 %
FVC-%Change-Post: 6 %
FVC-%Pred-Post: 89 %
FVC-%Pred-Pre: 84 %
FVC-Post: 2.76 L
FVC-Pre: 2.59 L
Post FEV1/FVC ratio: 81 %
Post FEV6/FVC ratio: 100 %
Pre FEV1/FVC ratio: 81 %
Pre FEV6/FVC Ratio: 100 %
RV % pred: 104 %
RV: 1.98 L
TLC % pred: 96 %
TLC: 4.57 L

## 2024-12-02 MED ORDER — ALBUTEROL SULFATE (2.5 MG/3ML) 0.083% IN NEBU
2.5000 mg | INHALATION_SOLUTION | Freq: Once | RESPIRATORY_TRACT | Status: AC
  Administered 2024-12-02: 2.5 mg via RESPIRATORY_TRACT

## 2024-12-05 ENCOUNTER — Ambulatory Visit: Payer: Self-pay | Admitting: Internal Medicine

## 2024-12-08 NOTE — Progress Notes (Signed)
 Called and relayed results to pt and pt confirmed understanding

## 2024-12-09 ENCOUNTER — Ambulatory Visit: Admitting: Internal Medicine

## 2024-12-09 ENCOUNTER — Encounter: Payer: Self-pay | Admitting: Internal Medicine

## 2024-12-09 VITALS — BP 118/82 | HR 67 | Ht 62.5 in | Wt 197.0 lb

## 2024-12-09 DIAGNOSIS — J432 Centrilobular emphysema: Secondary | ICD-10-CM | POA: Diagnosis not present

## 2024-12-09 NOTE — Patient Instructions (Addendum)
 Use your albuterol  as a rescue medication to be used if you can't catch your breath by resting, slowing your pace,  or doing a relaxed purse lip breathing pattern.  - The less you use it, the better it will work when you need it. - Ok to use up to 2 puffs  every 4 hours if you must but call for  appointment if use goes up over your usual need - Don't leave home without it !!  (think of it like a spare tire or starter fluid for your car)   Also  Ok to try albuterol  15 min before an activity (on alternating days)  that you know would usually make you short of breath and see if it makes any difference and if makes none then don't take albuterol  after activity unless you can't catch your breath as this means it's the resting that helps, not the albuterol .   If find needing albuterol  for exercise or more than twice weekly let me call you in Symbicort 80 Take 2 puffs first thing in am and then another 2 puffs about 12 hours later.   Please schedule a follow up visit in 4 months but call sooner if needed - bring inhalers

## 2024-12-09 NOTE — Progress Notes (Unsigned)
 "   Grace Gomez, female    DOB: July 28, 1966    MRN: [id number]   Brief patient profile:  58  yowf  quit smoking 2003 with alpha one phenotype MS  referred to pulmonary clinic in [place]  11/17/2024 by Grace Bruns FNP  for  doe/ wheezing  x 2014 improved p quit but worsened again around fall of 2024 but only using prn saba p exertion as of initial pulm eval       History of Present Illness  11/17/2024  Pulmonary/ 1st office eval/ Grace Gomez / [facility]  Chief Complaint  Patient presents with   Establish Care    Wheezing - DOE  Abn ct show emphysema    Dyspnea:  yard work, not doing much heavy  exertion or regular walking/ ex (afraid to due to doe) assoc with some wheezing better p saba  Cough: none  Sleep:  flat bed with 2 pillows  SABA use: rarely and usually p ex  02: none  Cp not exertional /  assoc HBP, generalized ant discomfort s radiation/ n v or diaphoresis Patient Instructions  Use your albuterol  as a rescue medication  Also  Ok to try albuterol  15 min before an activity (on alternating days)  that you know would usually make you short of breath  - Allergy screen 11/17/2024 >  Eos 0.2 /  alpha one AT phenotype MS/ level 130  Please schedule a follow up office visit in 6-8 weeks, call sooner if needed with all medications /inhalers/ solutions in hand     12/09/2024  f/u ov/[facility]/Grace Gomez re: doe  maint on prn saba   did not bring meds  Chief Complaint  Patient presents with   Shortness of Breath    Pft 12/24 Wanting to excising    Dyspnea:  fall  decor moving back into house more difficult that previous years  Cough: none  Sleeping: flat bed 2 pillow no   resp cc  SABA use: none since las ov  02: none   No obvious day to day or daytime variability or assoc excess/ purulent sputum or mucus plugs or hemoptysis or cp or chest tightness, subjective wheeze or overt sinus or hb symptoms.    Also denies any obvious fluctuation of symptoms with weather or  environmental changes or other aggravating or alleviating factors except as outlined above   No unusual exposure hx or h/o childhood pna/ asthma or knowledge of premature birth.  Current Allergies, Complete Past Medical History, Past Surgical History, Family History, and Social History were reviewed in [employer] record.  ROS  The following are not active complaints unless bolded Hoarseness, sore throat, dysphagia, dental problems, itching, sneezing,  nasal congestion or discharge of excess mucus or purulent secretions, ear ache,   fever, chills, sweats, unintended wt loss or wt gain, classically pleuritic or exertional cp,  orthopnea pnd or arm/hand swelling  or leg swelling, presyncope, palpitations, abdominal pain, anorexia, nausea, vomiting, diarrhea  or change in bowel habits or change in bladder habits, change in stools or change in urine, dysuria, hematuria,  rash, arthralgias, visual complaints, headache, numbness, weakness or ataxia or problems with walking or coordination,  change in mood or  memory.         Outpatient Medications Prior to Visit  Medication Sig Dispense Refill   albuterol  (VENTOLIN  HFA) 108 (90 Base) MCG/ACT inhaler Inhale 2 puffs into the lungs every 6 (six) hours as needed for wheezing or shortness of breath.  8 g 0   dexlansoprazole  (DEXILANT ) 60 MG capsule Take 1 capsule by mouth once daily 90 capsule 1   triamcinolone  cream (KENALOG ) 0.1 % Apply 1 Application topically 2 (two) times daily. 30 g 0   Vitamin D , Ergocalciferol , (DRISDOL ) 1.25 MG (50000 UNIT) CAPS capsule Take 1 capsule by mouth once a week 4 capsule 3   No facility-administered medications prior to visit.      Past Medical History:  Diagnosis Date   Breast CA (HCC)    GERD (gastroesophageal reflux disease)    Hyperlipidemia    Hypertension    IBS (irritable bowel syndrome)    diarrhea     Objective:     Wt Readings from Last 3 Encounters:  12/09/24 197 lb (89.4  kg)  11/17/24 197 lb (89.4 kg)  10/31/24 195 lb 3.2 oz (88.5 kg)      Vital signs reviewed  12/09/2024  - Note at rest 02 sats  99% on RA   General appearance:    amb wf nad     HEENT : Oropharynx  clear         NECK :  without  apparent JVD/ palpable Nodes/TM    LUNGS: no acc muscle use,  Nl contour chest which is clear to A and P bilaterally without cough on insp or exp maneuvers   CV:  RRR  no s3 or murmur or increase in P2, and no edema   ABD:  soft and nontender   MS:     ext warm without deformities Or obvious joint restrictions  calf tenderness, cyanosis or clubbing    SKIN: warm and dry without lesions    NEURO:  alert, approp, nl sensorium with  no motor or cerebellar deficits apparent.          Assessment   Assessment & Plan Centrilobular emphysema (HCC) Quit smoking in 2003  - Allergy screen 11/17/2024 >  Eos 0.2 /  alpha one AT phenotype MS/ level 130 - 11/17/2024   Walked on RA  x  3  lap(s) =  approx 450  ft  @ mod to fast pace, stopped due to end of study  with lowest 02 sats 93% but improved to 95% before stopped with min sob/ wheeze and no cp  - PFTs 12/02/24  nl with ? Prior to rx  - ERV 41% at wt 197 but FRC wnl  - 12/09/2024  After extensive coaching inhaler device,  effectiveness =    75% from baseline well < 50% (short ti and late trigger)  > try prn saba and if improved ex tol then rx symbciort 80 2bid   Re SABA :  I spent extra time with pt today reviewing appropriate use of albuterol  for prn use on exertion with the following points: 1) saba is for relief of sob that does not improve by walking a slower pace or resting but rather if the pt does not improve after trying this first. 2) If the pt is convinced, as many are, that saba helps recover from activity faster then it's easy to tell if this is the case by re-challenging : ie stop, take the inhaler, then p 5 minutes try the exact same activity (intensity of workload) that just caused the symptoms  and see if they are substantially diminished or not after saba 3) if there is an activity that reproducibly causes the symptoms, try the saba 15 min before the activity on alternate days   If in  fact the saba really does help, then fine to continue to use it prn but advised may need to look closer at the maintenance regimen (0 for now vs symbicort 80 2 bid)  being used to achieve better control of airways disease with exertion.   Discussed in detail all the  indications, usual  risks and alternatives  relative to the benefits with patient who agrees to proceed with Rx as outlined.             Each maintenance medication was reviewed in detail including emphasizing most importantly the difference between maintenance and prns and under what circumstances the prns are to be triggered using an action plan format where appropriate.  Total time for H and P, chart review, counseling, reviewing hfa  device(s) and generating customized AVS unique to this office visit / same day charting = 25 min            AVS  Patient Instructions  Use your albuterol  as a rescue medication to be used if you can't catch your breath by resting, slowing your pace,  or doing a relaxed purse lip breathing pattern.  - The less you use it, the better it will work when you need it. - Ok to use up to 2 puffs  every 4 hours if you must but call for  appointment if use goes up over your usual need - Don't leave home without it !!  (think of it like a spare tire or starter fluid for your car)   Also  Ok to try albuterol  15 min before an activity (on alternating days)  that you know would usually make you short of breath and see if it makes any difference and if makes none then don't take albuterol  after activity unless you can't catch your breath as this means it's the resting that helps, not the albuterol .   If find needing albuterol  for exercise or more than twice weekly let me call you in Symbicort 80 Take 2 puffs first  thing in am and then another 2 puffs about 12 hours later.   Please schedule a follow up visit in 4 months but call sooner if needed - bring inhalers           Ozell America, MD 12/10/2024                         "

## 2024-12-10 NOTE — Assessment & Plan Note (Addendum)
 Quit smoking in 2003  - Allergy screen 11/17/2024 >  Eos 0.2 /  alpha one AT phenotype MS/ level 130 - 11/17/2024   Walked on RA  x  3  lap(s) =  approx 450  ft  @ mod to fast pace, stopped due to end of study  with lowest 02 sats 93% but improved to 95% before stopped with min sob/ wheeze and no cp  - PFTs 12/02/24  nl with ? Prior to rx  - ERV 41% at wt 197 but FRC wnl  - 12/09/2024  After extensive coaching inhaler device,  effectiveness =    75% from baseline well < 50% (short ti and late trigger)  > try prn saba and if improved ex tol then rx symbciort 80 2bid   Re SABA :  I spent extra time with pt today reviewing appropriate use of albuterol  for prn use on exertion with the following points: 1) saba is for relief of sob that does not improve by walking a slower pace or resting but rather if the pt does not improve after trying this first. 2) If the pt is convinced, as many are, that saba helps recover from activity faster then it's easy to tell if this is the case by re-challenging : ie stop, take the inhaler, then p 5 minutes try the exact same activity (intensity of workload) that just caused the symptoms and see if they are substantially diminished or not after saba 3) if there is an activity that reproducibly causes the symptoms, try the saba 15 min before the activity on alternate days   If in fact the saba really does help, then fine to continue to use it prn but advised may need to look closer at the maintenance regimen (0 for now vs symbicort 80 2 bid)  being used to achieve better control of airways disease with exertion.   Discussed in detail all the  indications, usual  risks and alternatives  relative to the benefits with patient who agrees to proceed with Rx as outlined.             Each maintenance medication was reviewed in detail including emphasizing most importantly the difference between maintenance and prns and under what circumstances the prns are to be triggered using an  action plan format where appropriate.  Total time for H and P, chart review, counseling, reviewing hfa  device(s) and generating customized AVS unique to this office visit / same day charting = 25 min

## 2024-12-12 ENCOUNTER — Other Ambulatory Visit (HOSPITAL_COMMUNITY)
Admission: RE | Admit: 2024-12-12 | Discharge: 2024-12-12 | Disposition: A | Discharge status: Home / Self Care | Source: Ambulatory Visit | Attending: Family Medicine | Admitting: Family Medicine

## 2024-12-12 ENCOUNTER — Encounter: Payer: Self-pay | Admitting: Family Medicine

## 2024-12-12 ENCOUNTER — Ambulatory Visit (INDEPENDENT_AMBULATORY_CARE_PROVIDER_SITE_OTHER): Payer: Self-pay | Admitting: Family Medicine

## 2024-12-12 ENCOUNTER — Ambulatory Visit: Admitting: Family Medicine

## 2024-12-12 ENCOUNTER — Ambulatory Visit: Payer: Self-pay | Admitting: Family Medicine

## 2024-12-12 VITALS — BP 132/78 | HR 97 | Temp 95.7°F | Ht 62.5 in | Wt 198.2 lb

## 2024-12-12 DIAGNOSIS — I700 Atherosclerosis of aorta: Secondary | ICD-10-CM | POA: Diagnosis not present

## 2024-12-12 DIAGNOSIS — N898 Other specified noninflammatory disorders of vagina: Secondary | ICD-10-CM

## 2024-12-12 DIAGNOSIS — E782 Mixed hyperlipidemia: Secondary | ICD-10-CM | POA: Diagnosis not present

## 2024-12-12 DIAGNOSIS — E559 Vitamin D deficiency, unspecified: Secondary | ICD-10-CM | POA: Diagnosis not present

## 2024-12-12 DIAGNOSIS — J432 Centrilobular emphysema: Secondary | ICD-10-CM

## 2024-12-12 DIAGNOSIS — Z0000 Encounter for general adult medical examination without abnormal findings: Secondary | ICD-10-CM | POA: Insufficient documentation

## 2024-12-12 DIAGNOSIS — Z124 Encounter for screening for malignant neoplasm of cervix: Secondary | ICD-10-CM | POA: Diagnosis not present

## 2024-12-12 DIAGNOSIS — Z853 Personal history of malignant neoplasm of breast: Secondary | ICD-10-CM | POA: Diagnosis not present

## 2024-12-12 LAB — WET PREP FOR TRICH, YEAST, CLUE
Clue Cell Exam: NEGATIVE
Trichomonas Exam: NEGATIVE
Yeast Exam: NEGATIVE

## 2024-12-12 MED ORDER — ASPIRIN 81 MG PO TBEC: 81.0000 mg | DELAYED_RELEASE_TABLET | Freq: Every day | ORAL | Status: AC

## 2024-12-12 NOTE — Progress Notes (Signed)
 "  Complete physical exam  Patient: Grace Gomez   DOB: 03-Feb-1966   59 y.o. Female  MRN: [id number]  Subjective:    Chief Complaint  Patient presents with   Annual Exam   Vaginal Discharge    Grace Gomez is a 59 y.o. female who presents today for a complete physical exam. She reports consuming a general diet. The patient does not participate in regular exercise at present. She generally feels well. She reports sleeping well. She does have additional problems to discuss today.    Her blood pressure readings at home have been normal, with recent measurements of 137/85 mmHg and 118/82 mmHg. However, she notes that her blood pressure tends to be elevated during clinic visits due to anxiety. No associated symptoms such as headaches, chest pain, leg swelling, visual changes, weakness, or confusion.  She has a history of breast cancer, and her recent mammogram was negative for malignancy. She has dense breast tissue but no current concerns for malignancy. It has been five years since her breast cancer diagnosis.  She has central lobular emphysema, as previously diagnosed, and reports that she has been prescribed albuterol . She experiences shortness of breath and fatigue with exertion, such as when carrying her Christmas tree. She has not smoked since 2003.  She experiences gastrointestinal symptoms, including diarrhea, particularly after consuming fatty foods, which she attributes to her previous gallbladder removal. She is interested in dietary changes to manage these symptoms.  She has a history of atherosclerosis noted on a CT scan, with plaque buildup in her arteries. She is not currently taking a baby aspirin.  She mentions a persistent vaginal discharge since a previous yeast infection, though she denies any itching, burning, or bleeding.       Most recent fall risk assessment:    12/12/2024   10:19 AM  Fall Risk   Falls in the past year? 0  Risk for fall due to : No Fall Risks   Follow up Falls evaluation completed     Most recent depression screenings:    12/12/2024   10:19 AM 10/31/2024   10:44 AM  PHQ 2/9 Scores  PHQ - 2 Score 0 0  PHQ- 9 Score 0 0    Vision:Within last year and Dental: No current dental problems  Patient Active Problem List   Diagnosis Date Noted   Centrilobular emphysema (HCC) 11/17/2024   Former smoker 02/20/2024   Aortic atherosclerosis 02/20/2024   Abnormal CT of the chest 02/20/2024   History of right breast cancer 11/01/2021   Increased risk of breast cancer 03/24/2020   IBS (irritable bowel syndrome) 04/18/2019   Vitamin D  deficiency 04/21/2015   GERD (gastroesophageal reflux disease) 04/21/2015   Essential hypertension 04/21/2015   Hyperlipidemia 04/21/2015   Past Medical History:  Diagnosis Date   Breast CA (HCC)    GERD (gastroesophageal reflux disease)    Hyperlipidemia    Hypertension    IBS (irritable bowel syndrome)    diarrhea   Past Surgical History:  Procedure Laterality Date   BREAST BIOPSY Right 2020   invasive ductal ca   BREAST EXCISIONAL BIOPSY Right 2020   invaseive ductal CA   BREAST LUMPECTOMY     HERNIA REPAIR     TUBAL LIGATION  1990   Social History[1] Social History   Socioeconomic History   Marital status: Single    Spouse name: Not on file   Number of children: 3   Years of education: Not on file   Highest  education level: Not on file  Occupational History   Occupation: Single charity fundraiser  Tobacco Use   Smoking status: Former    Current packs/day: 0.00    Average packs/day: 1 pack/day for 21.1 years (21.1 ttl pk-yrs)    Types: Cigarettes    Start date: 07/06/1980    Quit date: 08/20/2001    Years since quitting: 23.3   Smokeless tobacco: Never  Vaping Use   Vaping status: Never Used  Substance and Sexual Activity   Alcohol use: No   Drug use: No   Sexual activity: Not on file  Other Topics Concern   Not on file  Social History Narrative   Not on file    Social Drivers of Health   Tobacco Use: Medium Risk (12/12/2024)   Patient History    Smoking Tobacco Use: Former    Smokeless Tobacco Use: Never    Passive Exposure: Not on file  Financial Resource Strain: Not on file  Food Insecurity: No Food Insecurity (05/03/2022)   Received from [facility]   Epic    Within the past 12 months, you worried that your food would run out before you got the money to buy more.: Never true    Within the past 12 months, the food you bought just didn't last and you didn't have money to get more.: Never true  Transportation Needs: Not on file  Physical Activity: Not on file  Stress: Not on file  Social Connections: Not on file  Intimate Partner Violence: Not At Risk (09/07/2022)   Received from [facility]   Epic    Within the last year, have you been afraid of your partner or ex-partner?: No    Within the last year, have you been humiliated or emotionally abused in other ways by your partner or ex-partner?: No    Within the last year, have you been kicked, hit, slapped, or otherwise physically hurt by your partner or ex-partner?: No    Within the last year, have you been raped or forced to have any kind of sexual activity by your partner or ex-partner?: No  Depression (PHQ2-9): Low Risk (12/12/2024)   Depression (PHQ2-9)    PHQ-2 Score: 0  Alcohol Screen: Not on file  Housing: Not on file  Utilities: Not on file  Health Literacy: Not on file (09/12/2023)   Family Status  Relation Name Status   Mother  Deceased   Father  Deceased   Brother  Deceased   Mat Aunt  Deceased   MGM  Deceased   MGF  Deceased   PGM  Deceased   PGF  Deceased   Cousin 1st Alive   Daughter  Alive   Daughter  Alive   Daughter  Alive  No partnership data on file   Family History  Problem Relation Age of Onset   Heart disease Mother 29   Hypertension Mother    Cancer Father    Heart disease Brother    Heart disease Maternal Aunt 46   Colon cancer Maternal  Grandmother    Crohn's disease Cousin    Irritable bowel syndrome Daughter    Allergies[2]    Patient Care Team: Aarushi Hemric, Rock HERO, FNP as PCP - General (Family Medicine) Berry, Debby RAMAN, PA-C (Oncology) Darlean Ozell NOVAK, MD as Consulting Physician (Pulmonary Disease)   Show/hide medication list[3]  ROS per HPI      Objective:     BP 132/78   Pulse 97   Temp (!) 95.7  F (35.4 C)   Ht 5' 2.5 (1.588 m)   Wt 198 lb 3.2 oz (89.9 kg)   LMP 01/18/2018   SpO2 97%   BMI 35.67 kg/m  BP Readings from Last 3 Encounters:  12/12/24 132/78  12/09/24 118/82  11/17/24 137/85   Wt Readings from Last 3 Encounters:  12/12/24 198 lb 3.2 oz (89.9 kg)  12/09/24 197 lb (89.4 kg)  11/17/24 197 lb (89.4 kg)   SpO2 Readings from Last 3 Encounters:  12/12/24 97%  12/09/24 99%  11/17/24 96%      Physical Exam Vitals and nursing note reviewed. Exam conducted with a chaperone present.  Constitutional:      General: She is not in acute distress.    Appearance: Normal appearance. She is well-developed and well-groomed. She is obese. She is not ill-appearing, toxic-appearing or diaphoretic.  HENT:     Head: Normocephalic and atraumatic.     Jaw: There is normal jaw occlusion.     Right Ear: Hearing, tympanic membrane, ear canal and external ear normal.     Left Ear: Hearing, tympanic membrane, ear canal and external ear normal.     Nose: Nose normal.     Mouth/Throat:     Lips: Pink.     Mouth: Mucous membranes are moist.     Pharynx: Oropharynx is clear. Uvula midline.  Eyes:     General: Lids are normal.     Extraocular Movements: Extraocular movements intact.     Conjunctiva/sclera: Conjunctivae normal.     Pupils: Pupils are equal, round, and reactive to light.  Neck:     Thyroid : No thyroid  mass, thyromegaly or thyroid  tenderness.     Vascular: No carotid bruit or JVD.     Trachea: Trachea and phonation normal.  Cardiovascular:     Rate and Rhythm: Normal rate and regular  rhythm.     Chest Wall: PMI is not displaced.     Pulses: Normal pulses.     Heart sounds: Normal heart sounds. No murmur heard.    No friction rub. No gallop.  Pulmonary:     Effort: Pulmonary effort is normal. No respiratory distress.     Breath sounds: Normal breath sounds. No wheezing.  Abdominal:     General: Abdomen is protuberant. Bowel sounds are normal. There is no distension or abdominal bruit.     Palpations: Abdomen is soft. There is no hepatomegaly or splenomegaly.     Tenderness: There is no abdominal tenderness. There is no right CVA tenderness or left CVA tenderness.     Hernia: No hernia is present. There is no hernia in the left inguinal area or right inguinal area.  Genitourinary:    Exam position: Lithotomy position.     Pubic Area: No rash or pubic lice.      Tanner stage (genital): 5.     Labia:        Right: No rash, tenderness, lesion or injury.        Left: No rash, tenderness, lesion or injury.      Urethra: No prolapse, urethral pain, urethral swelling or urethral lesion.     Vagina: No signs of injury and foreign body. Vaginal discharge (thin, clear) present. No erythema, tenderness, bleeding, lesions or prolapsed vaginal walls.     Cervix: No discharge, friability, erythema or cervical bleeding.     Rectum: Normal.  Musculoskeletal:        General: Normal range of motion.     Cervical back:  Normal range of motion and neck supple.     Right lower leg: No edema.     Left lower leg: No edema.  Lymphadenopathy:     Cervical: No cervical adenopathy.     Lower Body: No right inguinal adenopathy. No left inguinal adenopathy.  Skin:    General: Skin is warm and dry.     Capillary Refill: Capillary refill takes less than 2 seconds.     Coloration: Skin is not cyanotic, jaundiced or pale.     Findings: No rash.  Neurological:     General: No focal deficit present.     Mental Status: She is alert and oriented to person, place, and time.     Sensory: Sensation  is intact.     Motor: Motor function is intact.     Coordination: Coordination is intact.     Gait: Gait is intact.     Deep Tendon Reflexes: Reflexes are normal and symmetric.  Psychiatric:        Attention and Perception: Attention and perception normal.        Mood and Affect: Mood and affect normal.        Speech: Speech normal.        Behavior: Behavior normal. Behavior is cooperative.        Thought Content: Thought content normal.        Cognition and Memory: Cognition and memory normal.        Judgment: Judgment normal.       Last CBC Lab Results  Component Value Date   WBC 6.8 11/17/2024   HGB 14.0 11/17/2024   HCT 43.1 11/17/2024   MCV 91 11/17/2024   MCH 29.7 11/17/2024   RDW 12.8 11/17/2024   PLT 246 11/17/2024   Last metabolic panel Lab Results  Component Value Date   GLUCOSE 82 10/31/2024   NA 140 10/31/2024   K 4.5 10/31/2024   CL 103 10/31/2024   CO2 23 10/31/2024   BUN 14 10/31/2024   CREATININE 0.71 10/31/2024   EGFR 97 10/31/2024   CALCIUM  9.4 10/31/2024   PROT 6.6 10/31/2024   ALBUMIN 4.1 10/31/2024   LABGLOB 2.5 10/31/2024   AGRATIO 1.5 11/01/2021   BILITOT 0.3 10/31/2024   ALKPHOS 115 10/31/2024   AST 18 10/31/2024   ALT 18 10/31/2024   Last lipids Lab Results  Component Value Date   CHOL 227 (H) 10/31/2024   HDL 45 10/31/2024   LDLCALC 147 (H) 10/31/2024   TRIG 193 (H) 10/31/2024   CHOLHDL 5.0 (H) 10/31/2024   Last hemoglobin A1c Lab Results  Component Value Date   HGBA1C 5.9 (H) 02/20/2024   Last thyroid  functions Lab Results  Component Value Date   TSH 1.500 10/31/2024   T4TOTAL 8.8 02/20/2024   FREET4 1.28 10/31/2024   Last vitamin D  Lab Results  Component Value Date   VD25OH 39.3 02/20/2024   Last vitamin B12 and Folate Lab Results  Component Value Date   VITAMINB12 591 02/20/2024   FOLATE 7.4 02/20/2024        Assessment & Plan:    Routine Health Maintenance and Physical Exam  Immunization History   Administered Date(s) Administered   Influenza, Quadrivalent, Recombinant, Inj, Pf 10/01/2013, 08/30/2016, 08/23/2019   Influenza,inj,Quad PF,6+ Mos 10/01/2013, 08/30/2016, 02/24/2021   Influenza,inj,Quad PF,6-35 Mos 09/23/2015, 09/05/2018   Influenza-Unspecified 09/23/2015, 09/05/2018   Moderna Sars-Covid-2 Vaccination 04/13/2020, 05/11/2020, 01/12/2021   Tdap 05/20/2005, 08/08/2012, 07/26/2023   Zoster Recombinant(Shingrix) 08/06/2021, 10/15/2021  Health Maintenance  Topic Date Due   Pneumococcal Vaccine: 50+ Years (1 of 2 - PCV) Never done   COVID-19 Vaccine (4 - 2025-26 season) 12/28/2024 (Originally 08/11/2024)   Influenza Vaccine  03/10/2025 (Originally 07/11/2024)   Colonoscopy  10/11/2025   Mammogram  12/01/2025   Cervical Cancer Screening (HPV/Pap Cotest)  08/08/2027   DTaP/Tdap/Td (4 - Td or Tdap) 07/25/2033   Hepatitis C Screening  Completed   HIV Screening  Completed   Zoster Vaccines- Shingrix  Completed   Hepatitis B Vaccines 19-59 Average Risk  Aged Out   HPV VACCINES  Aged Out   Meningococcal B Vaccine  Aged Out    Discussed health benefits of physical activity, and encouraged her to engage in regular exercise appropriate for her age and condition.  Problem List Items Addressed This Visit       Cardiovascular and Mediastinum   Aortic atherosclerosis   Relevant Medications   aspirin EC 81 MG tablet     Respiratory   Centrilobular emphysema (HCC)     Other   Vitamin D  deficiency   Hyperlipidemia   Relevant Medications   aspirin EC 81 MG tablet   History of right breast cancer   Other Visit Diagnoses       Annual physical exam    -  Primary   Relevant Orders   Cytology - PAP     Screening for cervical cancer       Relevant Orders   Cytology - PAP     Vaginal discharge       Relevant Orders   WET PREP FOR TRICH, YEAST, CLUE   Cytology - PAP         Vaginal discharge Persistent vaginal discharge without new or worsening symptoms. No itching,  burning, or bleeding. Wet prep showed no yeast. Cervix appears normal with clear, thin discharge. 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 Symptoms include fatigue with exertion. Currently using albuterol  as needed. No current need for oxygen therapy unless condition worsens. - Continue albuterol  as needed - Monitor frequency of albuterol  use; if more than twice weekly, contact provider for potential initiation of Symbicort - Provided educational material on emphysema and COPD - Encouraged exercise and weight loss as tolerated  Aortic atherosclerosis Plaque buildup noted on CT scan. No current symptoms of chest pain or other cardiovascular issues. - Initiated daily baby aspirin 81 mg to prevent clot formation - Provided educational material on aortic atherosclerosis  Mixed hyperlipidemia Cholesterol levels slightly elevated.  History of right breast cancer Recent mammogram negative for malignancy. Dense breast tissue noted but no concerns for malignancy. - Continue annual mammograms  General Health Maintenance Blood pressure readings elevated in office but normal at home. No symptoms of hypertension-related complications. - Provided blood pressure cuff for home monitoring - Instructed to monitor blood pressure 2-3 times a week at different times - Will consider antihypertensive medication if home readings remain elevated - Provided educational material on blood pressure management       Return in about 3 months (around 03/12/2025) for HTN.     Rosaline Bruns, FNP      [1]  Social History Tobacco Use   Smoking status: Former    Current packs/day: 0.00    Average packs/day: 1 pack/day for 21.1 years (21.1 ttl pk-yrs)    Types: Cigarettes    Start date: 07/06/1980    Quit date: 08/20/2001    Years since  quitting: 23.3   Smokeless tobacco: Never  Vaping Use   Vaping status:  Never Used  Substance Use Topics   Alcohol use: No   Drug use: No  [2]  Allergies Allergen Reactions   Prednisone  Nausea And Vomiting   Rosuvastatin Other (See Comments)   Sulfa Antibiotics Hives  [3]  Outpatient Medications Prior to Visit  Medication Sig   albuterol  (VENTOLIN  HFA) 108 (90 Base) MCG/ACT inhaler Inhale 2 puffs into the lungs every 6 (six) hours as needed for wheezing or shortness of breath.   dexlansoprazole  (DEXILANT ) 60 MG capsule Take 1 capsule by mouth once daily   triamcinolone  cream (KENALOG ) 0.1 % Apply 1 Application topically 2 (two) times daily.   Vitamin D , Ergocalciferol , (DRISDOL ) 1.25 MG (50000 UNIT) CAPS capsule Take 1 capsule by mouth once a week   [DISCONTINUED] aspirin 81 MG chewable tablet Chew 81 mg by mouth daily.   No facility-administered medications prior to visit.   "

## 2024-12-12 NOTE — Patient Instructions (Signed)
 Goal BP:  Less than 130/80  Take your medications faithfully as prescribed. Maintain a healthy weight. Get at least 150 minutes of aerobic exercise per week. Minimize salt intake, less than 2000 mg per day. Minimize alcohol intake.  DASH Eating Plan DASH stands for Dietary Approaches to Stop Hypertension. The DASH eating plan is a healthy eating plan that has been shown to reduce high blood pressure (hypertension). Additional health benefits may include reducing the risk of type 2 diabetes mellitus, heart disease, and stroke. The DASH eating plan may also help with weight loss.  WHAT DO I NEED TO KNOW ABOUT THE DASH EATING PLAN? For the DASH eating plan, you will follow these general guidelines: Choose foods with a percent daily value for sodium of less than 5% (as listed on the food label). Use salt-free seasonings or herbs instead of table salt or sea salt. Check with your health care provider or pharmacist before using salt substitutes. Eat lower-sodium products, often labeled as lower sodium or no salt added. Eat fresh foods. Eat more vegetables, fruits, and low-fat dairy products. Choose whole grains. Look for the word whole as the first word in the ingredient list. Choose fish and skinless chicken or [place] more often than red meat. Limit fish, poultry, and meat to 6 oz (170 g) each day. Limit sweets, desserts, sugars, and sugary drinks. Choose heart-healthy fats. Limit cheese to 1 oz (28 g) per day. Eat more home-cooked food and less restaurant, buffet, and fast food. Limit fried foods. Cook foods using methods other than frying. Limit canned vegetables. If you do use them, rinse them well to decrease the sodium. When eating at a restaurant, ask that your food be prepared with less salt, or no salt if possible.  WHAT FOODS CAN I EAT? Seek help from a dietitian for individual calorie needs.  Grains Whole grain or whole wheat bread. [place]oshino rice. Whole grain or whole  wheat pasta. Quinoa, bulgur, and whole grain cereals. Low-sodium cereals. Corn or whole wheat flour tortillas. Whole grain cornbread. Whole grain crackers. Low-sodium crackers.  Vegetables Fresh or frozen vegetables (raw, steamed, roasted, or grilled). Low-sodium or reduced-sodium tomato and vegetable juices. Low-sodium or reduced-sodium tomato sauce and paste. Low-sodium or reduced-sodium canned vegetables.   Fruits All fresh, canned (in natural juice), or frozen fruits.  Meat and Other Protein Products Ground beef (85% or leaner), grass-fed beef, or beef trimmed of fat. Skinless chicken or [place]. Ground chicken or [place]. Pork trimmed of fat. All fish and seafood. Eggs. Dried beans, peas, or lentils. Unsalted nuts and seeds. Unsalted canned beans.  Dairy Low-fat dairy products, such as skim or 1% milk, 2% or reduced-fat cheeses, low-fat ricotta or cottage cheese, or plain low-fat yogurt. Low-sodium or reduced-sodium cheeses.  Fats and Oils Tub margarines without trans fats. Light or reduced-fat mayonnaise and salad dressings (reduced sodium). Avocado. Safflower, olive, or canola oils. Natural peanut or almond butter.  Other Unsalted popcorn and pretzels. The items listed above may not be a complete list of recommended foods or beverages. Contact your dietitian for more options.  WHAT FOODS ARE NOT RECOMMENDED?  Grains White bread. White pasta. White rice. Refined cornbread. Bagels and croissants. Crackers that contain trans fat.  Vegetables Creamed or fried vegetables. Vegetables in a cheese sauce. Regular canned vegetables. Regular canned tomato sauce and paste. Regular tomato and vegetable juices.  Fruits Dried fruits. Canned fruit in light or heavy syrup. Fruit juice.  Meat and Other Protein Products Fatty cuts of meat. Ribs,  chicken wings, bacon, sausage, bologna, salami, chitterlings, fatback, hot dogs, bratwurst, and packaged luncheon meats. Salted nuts and seeds. Canned  beans with salt.  Dairy Whole or 2% milk, cream, half-and-half, and cream cheese. Whole-fat or sweetened yogurt. Full-fat cheeses or blue cheese. Nondairy creamers and whipped toppings. Processed cheese, cheese spreads, or cheese curds.  Condiments Onion and garlic salt, seasoned salt, table salt, and sea salt. Canned and packaged gravies. Worcestershire sauce. Tartar sauce. Barbecue sauce. Teriyaki sauce. Soy sauce, including reduced sodium. Steak sauce. Fish sauce. Oyster sauce. Cocktail sauce. Horseradish. Ketchup and mustard. Meat flavorings and tenderizers. Bouillon cubes. Hot sauce. Tabasco sauce. Marinades. Taco seasonings. Relishes.  Fats and Oils Butter, stick margarine, lard, shortening, ghee, and bacon fat. Coconut, palm kernel, or palm oils. Regular salad dressings.  Other Pickles and olives. Salted popcorn and pretzels.  The items listed above may not be a complete list of foods and beverages to avoid. Contact your dietitian for more information.  WHERE CAN I FIND MORE INFORMATION? National Heart, [employer], and [employer] Institute: [email or web address] Document Released: 11/16/2011 Document Revised: 04/13/2014 Document Reviewed: 10/01/2013 [facility] Patient Information 2015 [place], [place]. This information is not intended to replace advice given to you by your health care provider. Make sure you discuss any questions you have with your health care provider.   I think that you would greatly benefit from seeing a nutritionist.  If you are interested, please call Dr. Wonda at [phone] to schedule an appointment.

## 2024-12-17 LAB — CYTOLOGY - PAP
Chlamydia: NEGATIVE
Comment: NEGATIVE
Comment: NEGATIVE
Comment: NEGATIVE
Comment: NEGATIVE
Comment: NORMAL
Diagnosis: NEGATIVE
HSV1: NEGATIVE
HSV2: NEGATIVE
High risk HPV: NEGATIVE
Neisseria Gonorrhea: NEGATIVE
Trichomonas: NEGATIVE

## 2025-01-14 ENCOUNTER — Encounter: Admitting: Family Medicine
